# Patient Record
Sex: Female | Born: 1990 | Race: White | Hispanic: No | Marital: Single | Smoking: Former smoker
Health system: Southern US, Community
[De-identification: ages and names within clinical notes are randomized; demographics above are authoritative.]

## PROBLEM LIST (undated history)

## (undated) DIAGNOSIS — O24419 Gestational diabetes mellitus in pregnancy, unspecified control: Secondary | ICD-10-CM

## (undated) HISTORY — PX: NO PAST SURGERIES: SHX2092

---

## 2014-11-24 ENCOUNTER — Inpatient Hospital Stay: Payer: Self-pay | Admitting: Obstetrics and Gynecology

## 2014-11-24 LAB — CBC WITH DIFFERENTIAL/PLATELET
Basophil #: 0 10*3/uL (ref 0.0–0.1)
Basophil %: 0.1 %
Eosinophil #: 0.1 10*3/uL (ref 0.0–0.7)
Eosinophil %: 0.5 %
HCT: 42.2 % (ref 35.0–47.0)
HGB: 14.2 g/dL (ref 12.0–16.0)
LYMPHS PCT: 9.4 %
Lymphocyte #: 1 10*3/uL (ref 1.0–3.6)
MCH: 30.1 pg (ref 26.0–34.0)
MCHC: 33.7 g/dL (ref 32.0–36.0)
MCV: 89 fL (ref 80–100)
MONO ABS: 0.5 x10 3/mm (ref 0.2–0.9)
MONOS PCT: 5 %
Neutrophil #: 9.3 10*3/uL — ABNORMAL HIGH (ref 1.4–6.5)
Neutrophil %: 85 %
PLATELETS: 163 10*3/uL (ref 150–440)
RBC: 4.72 10*6/uL (ref 3.80–5.20)
RDW: 13.5 % (ref 11.5–14.5)
WBC: 10.9 10*3/uL (ref 3.6–11.0)

## 2014-11-24 LAB — PROTEIN / CREATININE RATIO, URINE
CREATININE, URINE: 99.2 mg/dL (ref 30.0–125.0)
PROTEIN/CREAT. RATIO: 141 mg/g{creat} (ref 0–200)
Protein, Random Urine: 14 mg/dL — ABNORMAL HIGH (ref 0–12)

## 2014-11-24 LAB — RAPID HIV SCREEN (HIV 1/2 AB+AG)

## 2014-11-24 LAB — GC/CHLAMYDIA PROBE AMP

## 2014-11-25 LAB — HEMOGLOBIN: HGB: 13.3 g/dL (ref 12.0–16.0)

## 2015-02-23 LAB — SURGICAL PATHOLOGY

## 2015-03-01 NOTE — Op Note (Signed)
PATIENT NAME:  Kristina Cox, Kristina Cox MR#:  [id number] DATE OF BIRTH:  December 08, 1990  DATE OF PROCEDURE:  11/24/2014  TIME: 2242.  PREOPERATIVE DIAGNOSES:  1.  Intrauterine pregnancy at term.  2.  Active labor.  3.  Group B streptococcus positive.  4.  Rh negative.   POSTOPERATIVE DIAGNOSES: 1.  Intrauterine pregnancy at term.  2.  Active labor.  3.  Group B streptococcus positive.  4.  Rh negative.  5.  Delivery of a viable female infant.   SURGEON: Cline Cools, MD  ESTIMATED BLOOD LOSS: 400 mL  ANESTHESIA: Epidural.  COMPLICATIONS: None.   FINDING: Viable female infant who was floppy on delivery, with apgars of 3, 4, and 7 at 1, 5, and 10 minutes respectively. Weight is not known at this time. Normal appearing large placental, no meconium.   INDICATION FOR PROCEDURE: Kristina Cox is a 24 year old gravida 1, para 0 who presented at 39-3/7 weeks estimated gestational age in active labor. She was found to be 4 cm, 100% effaced, and at -1 station on admission with a bulging bag. Her group B streptococcus ampicillin was started on admission, and she did receive adequate treatment during this labor course. She progressed nicely with artificial rupture of membranes for clear fluid and no other augmentation. She was comfortable with her epidural and became fully dilated at 2130, at which time her fetal heart rate tracing was a category 1 strip with a baseline rate of 130s with moderate variability, no accelerations, and no decelerations.   The patient pushed for a total of 55 minutes comfortably with her epidural. During the second stage she had several 4- to 5-minute episodes of fetal tachycardia into the 180s, with return to baseline and moderate variability throughout. Maternal temperature prior to delivery was 99.6. Just prior to delivery the baby did undergo a sustained tachycardic episode to the 190s  x 10 minutes. Moderate variability was maintained during this time; however, 3  minutes prior to delivery the fetal heart tones returned to the 90s and I was concerned that we might be picking up the maternal heart rate. However, maternal heart rate was palpated and it was noted that the baby was truly in the 90s to 115 range with moderate variability. At this time, consideration for operative vaginal delivery was made. However, maternal effort was used to expel the baby within the next minute. The fetal head was delivered followed by the left anterior shoulder using downward traction. No shoulder dystocia was noted, as the left shoulder easily slipped below the pelvic arch, but once the shoulder delivery the infant's body did not immediately follow.  The fetal body was delivered with effort with the anterior arm requiring extraction by antecubital flexion and sweeping the arm across the chest, followed by the posterior arm and the fetal body. There was no nuchal cord noted. The cord was quickly doubly clamped and cut and a 3-vessel cord was noted. The fetus was flaccid immediately upon delivery and was passed to awaiting nursery staff. A short period of drying and stimulation was undertaken on the maternal abdomen and then the baby was moved to the warmer. The placenta delivered spontaneously immediately following the baby and was intact.   A second-degree perineal laceration was repaired with 2-0 Vicryl and there were bilateral periurethral lacerations that were repaired with 3-0 Vicryl. Examination of the vagina and cervix revealed no further lacerations. The fundal tone was firm with bimanual massage. Postpartum Pitocin was hung and the placenta was sent  to pathology for evaluation after the neonatal resuscitation was required. he mom tolerated these procedures well and is stable in recovery. Of note, the fetal Apgars were 3, 4, and 7 at 1, 5, and 10 minutes, respectively. Fetal pH was 6.97. Fetal cord blood gas pH was 7.12, base excess was -22, with a decreased bicarbonate of 9.0. The  neonatal lactic acid off the arterial line was 13.2. The baby was given oxygen and at this time appears to be doing well, with good tone, good color, and good reflexes.   ____________________________ Cline CoolsBethany E. Ayushi Pla, MD beb:ST D: 11/25/2014 01:13:25 ET T: 11/25/2014 02:07:39 ET JOB#: [id number]  cc: Cline CoolsBethany E. Jabre Heo, MD, <Dictator> Cline CoolsBETHANY E Tevis Conger MD ELECTRONICALLY SIGNED 11/25/2014 18:04

## 2015-03-10 NOTE — H&P (Signed)
L&D Evaluation:  History:  HPI [age]yo G1P0 at 39+3wks presenting with contractions, good fetal movement and no vaginal bleeding. No LOF. Elevated BP on admission, but not through pregnancy. PIH labs drawn on admission were wnl. P/C ratio 141. No HA.  Preg c/b: Rh neg GBS pos- NKDA   Presents with contractions   Patient's Medical History No Chronic Illness   Patient's Surgical History none   Medications Pre Natal Vitamins   Allergies NKDA   Social History none   Family History Non-Contributory   ROS:  ROS All systems were reviewed.  HEENT, CNS, GI, GU, Respiratory, CV, Renal and Musculoskeletal systems were found to be normal.   Exam:  Vital Signs stable  BP >140/90   General no apparent distress   Mental Status clear   Chest clear   Heart normal sinus rhythm   Abdomen gravid, tender with contractions   Estimated Fetal Weight Average for gestational age   Fetal Position cephalic   Reflexes 1+   Clonus negative   Mebranes Intact, bulging bag   FHT Description Variable decelerations, on admission, no decels   Ucx regular   Impression:  Impression active labor   Plan:  Plan monitor BP, PIH panel, antibiotics for GBBS prophylaxis   Electronic Signatures: Cline CoolsBeasley, Renn Dirocco E (MD)  (Signed 25-Jan-16 18:08)  Authored: L&D Evaluation   Last Updated: 25-Jan-16 18:08 by Cline CoolsBeasley, Hermelinda Diegel E (MD)

## 2016-06-01 ENCOUNTER — Ambulatory Visit (INDEPENDENT_AMBULATORY_CARE_PROVIDER_SITE_OTHER): Payer: PRIVATE HEALTH INSURANCE | Admitting: Obstetrics and Gynecology

## 2016-06-01 ENCOUNTER — Encounter: Payer: Self-pay | Admitting: Obstetrics and Gynecology

## 2016-06-01 VITALS — BP 124/76 | HR 93 | Temp 98.4°F | Wt 123.6 lb

## 2016-06-01 DIAGNOSIS — Z3480 Encounter for supervision of other normal pregnancy, unspecified trimester: Secondary | ICD-10-CM | POA: Insufficient documentation

## 2016-06-01 DIAGNOSIS — Z331 Pregnant state, incidental: Secondary | ICD-10-CM | POA: Diagnosis not present

## 2016-06-01 DIAGNOSIS — Z3482 Encounter for supervision of other normal pregnancy, second trimester: Secondary | ICD-10-CM

## 2016-06-01 DIAGNOSIS — Z1389 Encounter for screening for other disorder: Secondary | ICD-10-CM | POA: Diagnosis not present

## 2016-06-01 DIAGNOSIS — Z3401 Encounter for supervision of normal first pregnancy, first trimester: Secondary | ICD-10-CM

## 2016-06-01 DIAGNOSIS — Z3492 Encounter for supervision of normal pregnancy, unspecified, second trimester: Secondary | ICD-10-CM | POA: Diagnosis not present

## 2016-06-01 LAB — POCT URINALYSIS DIPSTICK
Bilirubin, UA: NEGATIVE
Blood, UA: NEGATIVE
Glucose, UA: NEGATIVE
Ketones, UA: NEGATIVE
Leukocytes, UA: NEGATIVE
Nitrite, UA: NEGATIVE
Protein, UA: NEGATIVE
Spec Grav, UA: 1.015
Urobilinogen, UA: 0.2
pH, UA: 7

## 2016-06-01 MED ORDER — PROMETHAZINE HCL 25 MG PO TABS: 25.0000 mg | ORAL_TABLET | Freq: Four times a day (QID) | ORAL | 1 refills | Status: DC | PRN

## 2016-06-01 MED ORDER — BUTALBITAL-APAP-CAFFEINE 50-325-40 MG PO TABS: 1.0000 | ORAL_TABLET | Freq: Four times a day (QID) | ORAL | 0 refills | Status: DC | PRN

## 2016-06-01 NOTE — Patient Instructions (Addendum)
Second Trimester of Pregnancy The second trimester is from week 13 through week 28, months 4 through 6. The second trimester is often a time when you feel your best. Your body has also adjusted to being pregnant, and you begin to feel better physically. Usually, morning sickness has lessened or quit completely, you may have more energy, and you may have an increase in appetite. The second trimester is also a time when the fetus is growing rapidly. At the end of the sixth month, the fetus is about 9 inches long and weighs about 1 pounds. You will likely begin to feel the baby move (quickening) between 18 and 20 weeks of the pregnancy. BODY CHANGES Your body goes through many changes during pregnancy. The changes vary from woman to woman.   Your weight will continue to increase. You will notice your lower abdomen bulging out.  You may begin to get stretch marks on your hips, abdomen, and breasts.  You may develop headaches that can be relieved by medicines approved by your health care provider.  You may urinate more often because the fetus is pressing on your bladder.  You may develop or continue to have heartburn as a result of your pregnancy.  You may develop constipation because certain hormones are causing the muscles that push waste through your intestines to slow down.  You may develop hemorrhoids or swollen, bulging veins (varicose veins).  You may have back pain because of the weight gain and pregnancy hormones relaxing your joints between the bones in your pelvis and as a result of a shift in weight and the muscles that support your balance.  Your breasts will continue to grow and be tender.  Your gums may bleed and may be sensitive to brushing and flossing.  Dark spots or blotches (chloasma, mask of pregnancy) may develop on your face. This will likely fade after the baby is born.  A dark line from your belly button to the pubic area (linea nigra) may appear. This will likely  fade after the baby is born.  You may have changes in your hair. These can include thickening of your hair, rapid growth, and changes in texture. Some women also have hair loss during or after pregnancy, or hair that feels dry or thin. Your hair will most likely return to normal after your baby is born. WHAT TO EXPECT AT YOUR PRENATAL VISITS During a routine prenatal visit:  You will be weighed to make sure you and the fetus are growing normally.  Your blood pressure will be taken.  Your abdomen will be measured to track your baby's growth.  The fetal heartbeat will be listened to.  Any test results from the previous visit will be discussed. Your health care provider may ask you:  How you are feeling.  If you are feeling the baby move.  If you have had any abnormal symptoms, such as leaking fluid, bleeding, severe headaches, or abdominal cramping.  If you are using any tobacco products, including cigarettes, chewing tobacco, and electronic cigarettes.  If you have any questions. Other tests that may be performed during your second trimester include:  Blood tests that check for:  Low iron levels (anemia).  Gestational diabetes (between 24 and 28 weeks).  Rh antibodies.  Urine tests to check for infections, diabetes, or protein in the urine.  An ultrasound to confirm the proper growth and development of the baby.  An amniocentesis to check for possible genetic problems.  Fetal screens for spina bifida   and Down syndrome.  HIV (human immunodeficiency virus) testing. Routine prenatal testing includes screening for HIV, unless you choose not to have this test. HOME CARE INSTRUCTIONS   Avoid all smoking, herbs, alcohol, and unprescribed drugs. These chemicals affect the formation and growth of the baby.  Do not use any tobacco products, including cigarettes, chewing tobacco, and electronic cigarettes. If you need help quitting, ask your health care provider. You may receive  counseling support and other resources to help you quit.  Follow your health care provider's instructions regarding medicine use. There are medicines that are either safe or unsafe to take during pregnancy.  Exercise only as directed by your health care provider. Experiencing uterine cramps is a good sign to stop exercising.  Continue to eat regular, healthy meals.  Wear a good support bra for breast tenderness.  Do not use hot tubs, steam rooms, or saunas.  Wear your seat belt at all times when driving.  Avoid raw meat, uncooked cheese, cat litter boxes, and soil used by cats. These carry germs that can cause birth defects in the baby.  Take your prenatal vitamins.  Take 1500-2000 mg of calcium daily starting at the 20th week of pregnancy until you deliver your baby.  Try taking a stool softener (if your health care provider approves) if you develop constipation. Eat more high-fiber foods, such as fresh vegetables or fruit and whole grains. Drink plenty of fluids to keep your urine clear or pale yellow.  Take warm sitz baths to soothe any pain or discomfort caused by hemorrhoids. Use hemorrhoid cream if your health care provider approves.  If you develop varicose veins, wear support hose. Elevate your feet for 15 minutes, 3-4 times a day. Limit salt in your diet.  Avoid heavy lifting, wear low heel shoes, and practice good posture.  Rest with your legs elevated if you have leg cramps or low back pain.  Visit your dentist if you have not gone yet during your pregnancy. Use a soft toothbrush to brush your teeth and be gentle when you floss.  A sexual relationship may be continued unless your health care provider directs you otherwise.  Continue to go to all your prenatal visits as directed by your health care provider. SEEK MEDICAL CARE IF:   You have dizziness.  You have mild pelvic cramps, pelvic pressure, or nagging pain in the abdominal area.  You have persistent nausea,  vomiting, or diarrhea.  You have a bad smelling vaginal discharge.  You have pain with urination. SEEK IMMEDIATE MEDICAL CARE IF:   You have a fever.  You are leaking fluid from your vagina.  You have spotting or bleeding from your vagina.  You have severe abdominal cramping or pain.  You have rapid weight gain or loss.  You have shortness of breath with chest pain.  You notice sudden or extreme swelling of your face, hands, ankles, feet, or legs.  You have not felt your baby move in over an hour.  You have severe headaches that do not go away with medicine.  You have vision changes.   This information is not intended to replace advice given to you by your health care provider. Make sure you discuss any questions you have with your health care provider.   Document Released: 10/11/2001 Document Revised: 11/07/2014 Document Reviewed: 12/18/2012 Elsevier Interactive Patient Education 2016 [employer]  Contraception Choices Contraception (birth control) is the use of any methods or devices to prevent pregnancy. Below are some methods to   help avoid pregnancy. HORMONAL METHODS   Contraceptive implant. This is a thin, plastic tube containing progesterone hormone. It does not contain estrogen hormone. Your health care provider inserts the tube in the inner part of the upper arm. The tube can remain in place for up to 3 years. After 3 years, the implant must be removed. The implant prevents the ovaries from releasing an egg (ovulation), thickens the cervical mucus to prevent sperm from entering the uterus, and thins the lining of the inside of the uterus.  Progesterone-only injections. These injections are given every 3 months by your health care provider to prevent pregnancy. This synthetic progesterone hormone stops the ovaries from releasing eggs. It also thickens cervical mucus and changes the uterine lining. This makes it harder for sperm to survive in the uterus.  Birth  control pills. These pills contain estrogen and progesterone hormone. They work by preventing the ovaries from releasing eggs (ovulation). They also cause the cervical mucus to thicken, preventing the sperm from entering the uterus. Birth control pills are prescribed by a health care provider.Birth control pills can also be used to treat heavy periods.  Minipill. This type of birth control pill contains only the progesterone hormone. They are taken every day of each month and must be prescribed by your health care provider.  Birth control patch. The patch contains hormones similar to those in birth control pills. It must be changed once a week and is prescribed by a health care provider.  Vaginal ring. The ring contains hormones similar to those in birth control pills. It is left in the vagina for 3 weeks, removed for 1 week, and then a new one is put back in place. The patient must be comfortable inserting and removing the ring from the vagina.A health care provider's prescription is necessary.  Emergency contraception. Emergency contraceptives prevent pregnancy after unprotected sexual intercourse. This pill can be taken right after sex or up to 5 days after unprotected sex. It is most effective the sooner you take the pills after having sexual intercourse. Most emergency contraceptive pills are available without a prescription. Check with your pharmacist. Do not use emergency contraception as your only form of birth control. BARRIER METHODS   Female condom. This is a thin sheath (latex or rubber) that is worn over the penis during sexual intercourse. It can be used with spermicide to increase effectiveness.  Female condom. This is a soft, loose-fitting sheath that is put into the vagina before sexual intercourse.  Diaphragm. This is a soft, latex, dome-shaped barrier that must be fitted by a health care provider. It is inserted into the vagina, along with a spermicidal jelly. It is inserted before  intercourse. The diaphragm should be left in the vagina for 6 to 8 hours after intercourse.  Cervical cap. This is a round, soft, latex or plastic cup that fits over the cervix and must be fitted by a health care provider. The cap can be left in place for up to 48 hours after intercourse.  Sponge. This is a soft, circular piece of polyurethane foam. The sponge has spermicide in it. It is inserted into the vagina after wetting it and before sexual intercourse.  Spermicides. These are chemicals that kill or block sperm from entering the cervix and uterus. They come in the form of creams, jellies, suppositories, foam, or tablets. They do not require a prescription. They are inserted into the vagina with an applicator before having sexual intercourse. The process must be repeated every   time you have sexual intercourse. INTRAUTERINE CONTRACEPTION  Intrauterine device (IUD). This is a T-shaped device that is put in a woman's uterus during a menstrual period to prevent pregnancy. There are 2 types:  Copper IUD. This type of IUD is wrapped in copper wire and is placed inside the uterus. Copper makes the uterus and fallopian tubes produce a fluid that kills sperm. It can stay in place for 10 years.  Hormone IUD. This type of IUD contains the hormone progestin (synthetic progesterone). The hormone thickens the cervical mucus and prevents sperm from entering the uterus, and it also thins the uterine lining to prevent implantation of a fertilized egg. The hormone can weaken or kill the sperm that get into the uterus. It can stay in place for 3-5 years, depending on which type of IUD is used. PERMANENT METHODS OF CONTRACEPTION  Female tubal ligation. This is when the woman's fallopian tubes are surgically sealed, tied, or blocked to prevent the egg from traveling to the uterus.  Hysteroscopic sterilization. This involves placing a small coil or insert into each fallopian tube. Your doctor uses a technique  called hysteroscopy to do the procedure. The device causes scar tissue to form. This results in permanent blockage of the fallopian tubes, so the sperm cannot fertilize the egg. It takes about 3 months after the procedure for the tubes to become blocked. You must use another form of birth control for these 3 months.  Female sterilization. This is when the female has the tubes that carry sperm tied off (vasectomy).This blocks sperm from entering the vagina during sexual intercourse. After the procedure, the man can still ejaculate fluid (semen). NATURAL PLANNING METHODS  Natural family planning. This is not having sexual intercourse or using a barrier method (condom, diaphragm, cervical cap) on days the woman could become pregnant.  Calendar method. This is keeping track of the length of each menstrual cycle and identifying when you are fertile.  Ovulation method. This is avoiding sexual intercourse during ovulation.  Symptothermal method. This is avoiding sexual intercourse during ovulation, using a thermometer and ovulation symptoms.  Post-ovulation method. This is timing sexual intercourse after you have ovulated. Regardless of which type or method of contraception you choose, it is important that you use condoms to protect against the transmission of sexually transmitted infections (STIs). Talk with your health care provider about which form of contraception is most appropriate for you.   This information is not intended to replace advice given to you by your health care provider. Make sure you discuss any questions you have with your health care provider.   Document Released: 10/17/2005 Document Revised: 10/22/2013 Document Reviewed: 04/11/2013 Elsevier Interactive Patient Education 2016 [employer]  Breastfeeding Deciding to breastfeed is one of the best choices you can make for you and your baby. A change in hormones during pregnancy causes your breast tissue to grow and increases the  number and size of your milk ducts. These hormones also allow proteins, sugars, and fats from your blood supply to make breast milk in your milk-producing glands. Hormones prevent breast milk from being released before your baby is born as well as prompt milk flow after birth. Once breastfeeding has begun, thoughts of your baby, as well as his or her sucking or crying, can stimulate the release of milk from your milk-producing glands.  BENEFITS OF BREASTFEEDING For Your Baby  Your first milk (colostrum) helps your baby's digestive system function better.  There are antibodies in your milk that help   your baby fight off infections.  Your baby has a lower incidence of asthma, allergies, and sudden infant death syndrome.  The nutrients in breast milk are better for your baby than infant formulas and are designed uniquely for your baby's needs.  Breast milk improves your baby's brain development.  Your baby is less likely to develop other conditions, such as childhood obesity, asthma, or type 2 diabetes mellitus. For You  Breastfeeding helps to create a very special bond between you and your baby.  Breastfeeding is convenient. Breast milk is always available at the correct temperature and costs nothing.  Breastfeeding helps to burn calories and helps you lose the weight gained during pregnancy.  Breastfeeding makes your uterus contract to its prepregnancy size faster and slows bleeding (lochia) after you give birth.   Breastfeeding helps to lower your risk of developing type 2 diabetes mellitus, osteoporosis, and breast or ovarian cancer later in life. SIGNS THAT YOUR BABY IS HUNGRY Early Signs of Hunger  Increased alertness or activity.  Stretching.  Movement of the head from side to side.  Movement of the head and opening of the mouth when the corner of the mouth or cheek is stroked (rooting).  Increased sucking sounds, smacking lips, cooing, sighing, or squeaking.  Hand-to-mouth  movements.  Increased sucking of fingers or hands. Late Signs of Hunger  Fussing.  Intermittent crying. Extreme Signs of Hunger Signs of extreme hunger will require calming and consoling before your baby will be able to breastfeed successfully. Do not wait for the following signs of extreme hunger to occur before you initiate breastfeeding:  Restlessness.  A loud, strong cry.  Screaming. BREASTFEEDING BASICS Breastfeeding Initiation  Find a comfortable place to sit or lie down, with your neck and back well supported.  Place a pillow or rolled up blanket under your baby to bring him or her to the level of your breast (if you are seated). Nursing pillows are specially designed to help support your arms and your baby while you breastfeed.  Make sure that your baby's abdomen is facing your abdomen.  Gently massage your breast. With your fingertips, massage from your chest wall toward your nipple in a circular motion. This encourages milk flow. You may need to continue this action during the feeding if your milk flows slowly.  Support your breast with 4 fingers underneath and your thumb above your nipple. Make sure your fingers are well away from your nipple and your baby's mouth.  Stroke your baby's lips gently with your finger or nipple.  When your baby's mouth is open wide enough, quickly bring your baby to your breast, placing your entire nipple and as much of the colored area around your nipple (areola) as possible into your baby's mouth.  More areola should be visible above your baby's upper lip than below the lower lip.  Your baby's tongue should be between his or her lower gum and your breast.  Ensure that your baby's mouth is correctly positioned around your nipple (latched). Your baby's lips should create a seal on your breast and be turned out (everted).  It is common for your baby to suck about 2-3 minutes in order to start the flow of breast milk. Latching Teaching  your baby how to latch on to your breast properly is very important. An improper latch can cause nipple pain and decreased milk supply for you and poor weight gain in your baby. Also, if your baby is not latched onto your nipple properly, he   or she may swallow some air during feeding. This can make your baby fussy. Burping your baby when you switch breasts during the feeding can help to get rid of the air. However, teaching your baby to latch on properly is still the best way to prevent fussiness from swallowing air while breastfeeding. Signs that your baby has successfully latched on to your nipple:  Silent tugging or silent sucking, without causing you pain.  Swallowing heard between every 3-4 sucks.  Muscle movement above and in front of his or her ears while sucking. Signs that your baby has not successfully latched on to nipple:  Sucking sounds or smacking sounds from your baby while breastfeeding.  Nipple pain. If you think your baby has not latched on correctly, slip your finger into the corner of your baby's mouth to break the suction and place it between your baby's gums. Attempt breastfeeding initiation again. Signs of Successful Breastfeeding Signs from your baby:  A gradual decrease in the number of sucks or complete cessation of sucking.  Falling asleep.  Relaxation of his or her body.  Retention of a small amount of milk in his or her mouth.  Letting go of your breast by himself or herself. Signs from you:  Breasts that have increased in firmness, weight, and size 1-3 hours after feeding.  Breasts that are softer immediately after breastfeeding.  Increased milk volume, as well as a change in milk consistency and color by the fifth day of breastfeeding.  Nipples that are not sore, cracked, or bleeding. Signs That Your Baby is Getting Enough Milk  Wetting at least 3 diapers in a 24-hour period. The urine should be clear and pale yellow by age 5 days.  At least 3  stools in a 24-hour period by age 5 days. The stool should be soft and yellow.  At least 3 stools in a 24-hour period by age 7 days. The stool should be seedy and yellow.  No loss of weight greater than 10% of birth weight during the first 3 days of age.  Average weight gain of 4-7 ounces (113-198 g) per week after age 4 days.  Consistent daily weight gain by age 5 days, without weight loss after the age of 2 weeks. After a feeding, your baby may spit up a small amount. This is common. BREASTFEEDING FREQUENCY AND DURATION Frequent feeding will help you make more milk and can prevent sore nipples and breast engorgement. Breastfeed when you feel the need to reduce the fullness of your breasts or when your baby shows signs of hunger. This is called "breastfeeding on demand." Avoid introducing a pacifier to your baby while you are working to establish breastfeeding (the first 4-6 weeks after your baby is born). After this time you may choose to use a pacifier. Research has shown that pacifier use during the first year of a baby's life decreases the risk of sudden infant death syndrome (SIDS). Allow your baby to feed on each breast as long as he or she wants. Breastfeed until your baby is finished feeding. When your baby unlatches or falls asleep while feeding from the first breast, offer the second breast. Because newborns are often sleepy in the first few weeks of life, you may need to awaken your baby to get him or her to feed. Breastfeeding times will vary from baby to baby. However, the following rules can serve as a guide to help you ensure that your baby is properly fed:  Newborns (babies 4 weeks   of age or younger) may breastfeed every 1-3 hours.  Newborns should not go longer than 3 hours during the day or 5 hours during the night without breastfeeding.  You should breastfeed your baby a minimum of 8 times in a 24-hour period until you begin to introduce solid foods to your baby at around 6  months of age. BREAST MILK PUMPING Pumping and storing breast milk allows you to ensure that your baby is exclusively fed your breast milk, even at times when you are unable to breastfeed. This is especially important if you are going back to work while you are still breastfeeding or when you are not able to be present during feedings. Your lactation consultant can give you guidelines on how long it is safe to store breast milk. A breast pump is a machine that allows you to pump milk from your breast into a sterile bottle. The pumped breast milk can then be stored in a refrigerator or freezer. Some breast pumps are operated by hand, while others use electricity. Ask your lactation consultant which type will work best for you. Breast pumps can be purchased, but some hospitals and breastfeeding support groups lease breast pumps on a monthly basis. A lactation consultant can teach you how to hand express breast milk, if you prefer not to use a pump. CARING FOR YOUR BREASTS WHILE YOU BREASTFEED Nipples can become dry, cracked, and sore while breastfeeding. The following recommendations can help keep your breasts moisturized and healthy:  Avoid using soap on your nipples.  Wear a supportive bra. Although not required, special nursing bras and tank tops are designed to allow access to your breasts for breastfeeding without taking off your entire bra or top. Avoid wearing underwire-style bras or extremely tight bras.  Air dry your nipples for 3-4minutes after each feeding.  Use only cotton bra pads to absorb leaked breast milk. Leaking of breast milk between feedings is normal.  Use lanolin on your nipples after breastfeeding. Lanolin helps to maintain your skin's normal moisture barrier. If you use pure lanolin, you do not need to wash it off before feeding your baby again. Pure lanolin is not toxic to your baby. You may also hand express a few drops of breast milk and gently massage that milk into your  nipples and allow the milk to air dry. In the first few weeks after giving birth, some women experience extremely full breasts (engorgement). Engorgement can make your breasts feel heavy, warm, and tender to the touch. Engorgement peaks within 3-5 days after you give birth. The following recommendations can help ease engorgement:  Completely empty your breasts while breastfeeding or pumping. You may want to start by applying warm, moist heat (in the shower or with warm water-soaked hand towels) just before feeding or pumping. This increases circulation and helps the milk flow. If your baby does not completely empty your breasts while breastfeeding, pump any extra milk after he or she is finished.  Wear a snug bra (nursing or regular) or tank top for 1-2 days to signal your body to slightly decrease milk production.  Apply ice packs to your breasts, unless this is too uncomfortable for you.  Make sure that your baby is latched on and positioned properly while breastfeeding. If engorgement persists after 48 hours of following these recommendations, contact your health care provider or a lactation consultant. OVERALL HEALTH CARE RECOMMENDATIONS WHILE BREASTFEEDING  Eat healthy foods. Alternate between meals and snacks, eating 3 of each per day. Because what   you eat affects your breast milk, some of the foods may make your baby more irritable than usual. Avoid eating these foods if you are sure that they are negatively affecting your baby.  Drink milk, fruit juice, and water to satisfy your thirst (about 10 glasses a day).  Rest often, relax, and continue to take your prenatal vitamins to prevent fatigue, stress, and anemia.  Continue breast self-awareness checks.  Avoid chewing and smoking tobacco. Chemicals from cigarettes that pass into breast milk and exposure to secondhand smoke may harm your baby.  Avoid alcohol and drug use, including marijuana. Some medicines that may be harmful to your  baby can pass through breast milk. It is important to ask your health care provider before taking any medicine, including all over-the-counter and prescription medicine as well as vitamin and herbal supplements. It is possible to become pregnant while breastfeeding. If birth control is desired, ask your health care provider about options that will be safe for your baby. SEEK MEDICAL CARE IF:  You feel like you want to stop breastfeeding or have become frustrated with breastfeeding.  You have painful breasts or nipples.  Your nipples are cracked or bleeding.  Your breasts are red, tender, or warm.  You have a swollen area on either breast.  You have a fever or chills.  You have nausea or vomiting.  You have drainage other than breast milk from your nipples.  Your breasts do not become full before feedings by the fifth day after you give birth.  You feel sad and depressed.  Your baby is too sleepy to eat well.  Your baby is having trouble sleeping.   Your baby is wetting less than 3 diapers in a 24-hour period.  Your baby has less than 3 stools in a 24-hour period.  Your baby's skin or the white part of his or her eyes becomes yellow.   Your baby is not gaining weight by 5 days of age. SEEK IMMEDIATE MEDICAL CARE IF:  Your baby is overly tired (lethargic) and does not want to wake up and feed.  Your baby develops an unexplained fever.   This information is not intended to replace advice given to you by your health care provider. Make sure you discuss any questions you have with your health care provider.   Document Released: 10/17/2005 Document Revised: 07/08/2015 Document Reviewed: 04/10/2013 Elsevier Interactive Patient Education 2016 Elsevier Inc.  

## 2016-06-01 NOTE — Progress Notes (Signed)
  Subjective:    Kristina Cox is a [id number] [redacted]w[redacted]d being seen today for her first obstetrical visit.  Her obstetrical history is significant for normal uncomplicated first pregnancy. Patient does intend to breast feed. Pregnancy history fully reviewed.  Patient reports headache and nausea.  Vitals:   06/01/16 1012  BP: 124/76  Pulse: 93  Temp: 98.4 F (36.9 C)  Weight: 123 lb 9.6 oz (56.1 kg)    HISTORY: OB History  Gravida Para Term Preterm AB Living  3 1 1   1 1   SAB TAB Ectopic Multiple Live Births    1     1    # Outcome Date GA Lbr Len/2nd Weight Sex Delivery Anes PTL Lv  3 Current           2 TAB 10/2015          1 Term 11/24/14 [redacted]w[redacted]d  8 lb 11 oz (3.941 kg) F Vag-Spont EPI N LIV     Complications: Shoulder Dystocia     Past Medical History:  Diagnosis Date  . Medical history non-contributory    History reviewed. No pertinent surgical history. Family History  Problem Relation Age of Onset  . Cancer Maternal Grandmother      Exam    Uterus:     Pelvic Exam:    Perineum: No Hemorrhoids, Normal Perineum   Vulva: normal   Vagina:  normal mucosa, normal discharge   pH:    Cervix: multiparous appearance and cervix closed and long   Adnexa: normal adnexa and no mass, fullness, tenderness   Bony Pelvis: gynecoid  System: Breast:  normal appearance, no masses or tenderness   Skin: normal coloration and turgor, no rashes    Neurologic: oriented, no focal deficits   Extremities: normal strength, tone, and muscle mass   HEENT extra ocular movement intact   Mouth/Teeth mucous membranes moist, pharynx normal without lesions and dental hygiene good   Neck supple and no masses   Cardiovascular: regular rate and rhythm   Respiratory:  chest clear, no wheezing, crepitations, rhonchi, normal symmetric air entry   Abdomen: soft, non-tender; bowel sounds normal; no masses,  no organomegaly   Urinary:       Assessment:    Pregnancy: G3P1011 Patient Active  Problem List   Diagnosis Date Noted  . Supervision of other normal pregnancy, antepartum 06/01/2016        Plan:     Initial labs drawn. Prenatal vitamins. Problem list reviewed and updated. Genetic Screening discussed Quad Screen: requested.Will be done at next visit  Ultrasound discussed; fetal survey: requested. Rx phenergan provided. Patient with h/o migraine headaches which seems to be getting worst. She has not taken tylenol to treat them but has used a friend's Fioricet with good results. Advised to try tylenol and good hydration. Rx Fioricet made available to the patient as well  Follow up in 4 weeks. 50% of 30 min visit spent on counseling and coordination of care.     Kapri Nero 06/01/2016

## 2016-06-03 LAB — CULTURE, OB URINE

## 2016-06-03 LAB — GC/CHLAMYDIA PROBE AMP
CHLAMYDIA, DNA PROBE: NEGATIVE
Neisseria gonorrhoeae by PCR: NEGATIVE

## 2016-06-03 LAB — URINE CULTURE, OB REFLEX: ORGANISM ID, BACTERIA: NO GROWTH

## 2016-06-07 LAB — PAP IG W/ RFLX HPV ASCU: PAP SMEAR COMMENT: 0

## 2016-06-07 LAB — HPV DNA PROBE HIGH RISK, AMPLIFIED: HPV, HIGH-RISK: POSITIVE — AB

## 2016-06-09 ENCOUNTER — Encounter: Payer: Self-pay | Admitting: Obstetrics and Gynecology

## 2016-06-09 DIAGNOSIS — O26899 Other specified pregnancy related conditions, unspecified trimester: Secondary | ICD-10-CM

## 2016-06-09 DIAGNOSIS — Z6791 Unspecified blood type, Rh negative: Secondary | ICD-10-CM | POA: Insufficient documentation

## 2016-06-09 LAB — PRENATAL PROFILE I(LABCORP)
Antibody Screen: NEGATIVE
BASOS ABS: 0 10*3/uL (ref 0.0–0.2)
Basos: 0 %
EOS (ABSOLUTE): 0.1 10*3/uL (ref 0.0–0.4)
Eos: 2 %
Hematocrit: 40.9 % (ref 34.0–46.6)
Hemoglobin: 13.5 g/dL (ref 11.1–15.9)
Hepatitis B Surface Ag: NEGATIVE
Immature Grans (Abs): 0 10*3/uL (ref 0.0–0.1)
Immature Granulocytes: 1 %
LYMPHS ABS: 1.3 10*3/uL (ref 0.7–3.1)
Lymphs: 21 %
MCH: 28.8 pg (ref 26.6–33.0)
MCHC: 33 g/dL (ref 31.5–35.7)
MCV: 87 fL (ref 79–97)
Monocytes Absolute: 0.3 10*3/uL (ref 0.1–0.9)
Monocytes: 6 %
NEUTROS PCT: 70 %
Neutrophils Absolute: 4.3 10*3/uL (ref 1.4–7.0)
PLATELETS: 223 10*3/uL (ref 150–379)
RBC: 4.68 x10E6/uL (ref 3.77–5.28)
RDW: 15.9 % — ABNORMAL HIGH (ref 12.3–15.4)
RPR Ser Ql: NONREACTIVE
Rh Factor: NEGATIVE
Rubella Antibodies, IGG: 2.19 index (ref >0.99)
WBC: 6.1 10*3/uL (ref 3.4–10.8)

## 2016-06-09 LAB — VARICELLA ZOSTER ANTIBODY, IGG: Varicella zoster IgG: 1111 index (ref >165)

## 2016-06-09 LAB — TOXASSURE SELECT 13 (MW), URINE: PDF: 0

## 2016-06-09 LAB — HIV ANTIBODY (ROUTINE TESTING W REFLEX): HIV SCREEN 4TH GENERATION: NONREACTIVE

## 2016-06-29 ENCOUNTER — Ambulatory Visit (INDEPENDENT_AMBULATORY_CARE_PROVIDER_SITE_OTHER): Payer: Medicaid Other | Admitting: Obstetrics and Gynecology

## 2016-06-29 VITALS — BP 105/67 | HR 76 | Temp 98.5°F | Wt 129.0 lb

## 2016-06-29 DIAGNOSIS — Z331 Pregnant state, incidental: Secondary | ICD-10-CM

## 2016-06-29 DIAGNOSIS — O360120 Maternal care for anti-D [Rh] antibodies, second trimester, not applicable or unspecified: Secondary | ICD-10-CM | POA: Diagnosis not present

## 2016-06-29 DIAGNOSIS — Z3482 Encounter for supervision of other normal pregnancy, second trimester: Secondary | ICD-10-CM | POA: Diagnosis not present

## 2016-06-29 DIAGNOSIS — Z1389 Encounter for screening for other disorder: Secondary | ICD-10-CM

## 2016-06-29 LAB — POCT URINALYSIS DIPSTICK
BILIRUBIN UA: NEGATIVE
Blood, UA: NEGATIVE
Glucose, UA: NEGATIVE
KETONES UA: NEGATIVE
Leukocytes, UA: NEGATIVE
Nitrite, UA: NEGATIVE
PH UA: 7
PROTEIN UA: NEGATIVE
SPEC GRAV UA: 1.01
Urobilinogen, UA: 0.2

## 2016-06-29 NOTE — Addendum Note (Signed)
Addended by: Francene FindersJAMES, QUINETTA C on: 06/29/2016 10:09 AM   Modules accepted: Orders

## 2016-06-29 NOTE — Progress Notes (Signed)
Pt. Has a question about getting a genetics test done.

## 2016-06-29 NOTE — Addendum Note (Signed)
Addended by: Catalina AntiguaONSTANT, Quentyn Kolbeck on: 06/29/2016 09:42 AM   Modules accepted: Orders

## 2016-06-29 NOTE — Progress Notes (Signed)
   PRENATAL VISIT NOTE  Subjective:  Kristina Cox is a 25 y.o. G3P1011 at 7497w5d being seen today for ongoing prenatal care.  She is currently monitored for the following issues for this low-risk pregnancy and has Supervision of other normal pregnancy, antepartum and Rh negative, antepartum on her problem list.  Patient reports no complaints.  Contractions: Not present. Vag. Bleeding: None.  Movement: Present. Denies leaking of fluid.   The following portions of the patient's history were reviewed and updated as appropriate: allergies, current medications, past family history, past medical history, past social history, past surgical history and problem list. Problem list updated.  Objective:   Vitals:   06/29/16 0908  BP: 105/67  Pulse: 76  Temp: 98.5 F (36.9 C)  Weight: 129 lb (58.5 kg)    Fetal Status: Fetal Heart Rate (bpm): 145   Movement: Present     General:  Alert, oriented and cooperative. Patient is in no acute distress.  Skin: Skin is warm and dry. No rash noted.   Cardiovascular: Normal heart rate noted  Respiratory: Normal respiratory effort, no problems with respiration noted  Abdomen: Soft, gravid, appropriate for gestational age. Pain/Pressure: Absent     Pelvic:  Cervical exam deferred        Extremities: Normal range of motion.  Edema: None  Mental Status: Normal mood and affect. Normal behavior. Normal judgment and thought content.   Urinalysis:      Assessment and Plan:  Pregnancy: G3P1011 at 4597w5d  1. Supervision of other normal pregnancy, antepartum, second trimester Patient is doing well without complaints Quad screen today Anatomy ultrasound ordered - US OB Comp + 14 Wk; Future - AFP, Quad Screen  2. Rh negative, antepartum, second trimester, not applicable or unspecified fetus Will receive rhogam at 28 weeks  General obstetric precautions including but not limited to vaginal bleeding, contractions, leaking of fluid and fetal movement  were reviewed in detail with the patient. Please refer to After Visit Summary for other counseling recommendations.  Return in about 4 weeks (around 07/27/2016).  Catalina AntiguaPeggy Graciella Arment, MD

## 2016-07-06 ENCOUNTER — Telehealth: Payer: Self-pay | Admitting: *Deleted

## 2016-07-06 LAB — AFP, QUAD SCREEN
DIA Mom Value: 0.81
DIA Value (EIA): 156.41 pg/mL
DSR (BY AGE) 1 IN: 1084
DSR (Second Trimester) 1 IN: 10000
Gestational Age: 16.7 WEEKS
MATERNAL AGE AT EDD: 23.6 a
MSAFP MOM: 0.71
MSAFP: 28.5 ng/mL
MSHCG Mom: 0.49
MSHCG: 18930 m[IU]/mL
OSB RISK: 10000
PDF: 0
T18 (By Age): 1:4225 {titer}
Test Results:: NEGATIVE
UE3 VALUE: 1.44 ng/mL
Weight: 129 [lb_av]
uE3 Mom: 1.4

## 2016-07-06 NOTE — Telephone Encounter (Signed)
Missing Quad Screen information called to [facility].

## 2016-07-13 ENCOUNTER — Encounter (HOSPITAL_COMMUNITY): Payer: Self-pay | Admitting: Obstetrics and Gynecology

## 2016-07-20 ENCOUNTER — Other Ambulatory Visit: Payer: Self-pay | Admitting: Obstetrics and Gynecology

## 2016-07-20 ENCOUNTER — Ambulatory Visit (HOSPITAL_COMMUNITY)
Admission: RE | Admit: 2016-07-20 | Discharge: 2016-07-20 | Disposition: A | Discharge status: Home / Self Care | Payer: Medicaid Other | Source: Ambulatory Visit | Attending: Obstetrics and Gynecology | Admitting: Obstetrics and Gynecology

## 2016-07-20 DIAGNOSIS — Z3A19 19 weeks gestation of pregnancy: Secondary | ICD-10-CM | POA: Diagnosis not present

## 2016-07-20 DIAGNOSIS — Z36 Encounter for antenatal screening of mother: Secondary | ICD-10-CM | POA: Diagnosis not present

## 2016-07-20 DIAGNOSIS — Z1389 Encounter for screening for other disorder: Secondary | ICD-10-CM

## 2016-07-20 DIAGNOSIS — Z3482 Encounter for supervision of other normal pregnancy, second trimester: Secondary | ICD-10-CM

## 2016-07-27 ENCOUNTER — Ambulatory Visit (INDEPENDENT_AMBULATORY_CARE_PROVIDER_SITE_OTHER): Payer: Medicaid Other | Admitting: Obstetrics and Gynecology

## 2016-07-27 VITALS — BP 112/70 | HR 94 | Temp 99.7°F | Wt 131.8 lb

## 2016-07-27 DIAGNOSIS — Z3482 Encounter for supervision of other normal pregnancy, second trimester: Secondary | ICD-10-CM

## 2016-07-27 DIAGNOSIS — O360120 Maternal care for anti-D [Rh] antibodies, second trimester, not applicable or unspecified: Secondary | ICD-10-CM | POA: Diagnosis not present

## 2016-07-27 NOTE — Progress Notes (Signed)
Patient states that she is feeling well. 

## 2016-07-27 NOTE — Progress Notes (Signed)
   PRENATAL VISIT NOTE  Subjective:  Kristina Cox is a 25 y.o. G3P1011 at 6629w5d being seen today for ongoing prenatal care.  She is currently monitored for the following issues for this low-risk pregnancy and has Supervision of other normal pregnancy, antepartum and Rh negative, antepartum on her problem list.  Patient reports no complaints.  Contractions: Not present. Vag. Bleeding: None.  Movement: Present. Denies leaking of fluid.   The following portions of the patient's history were reviewed and updated as appropriate: allergies, current medications, past family history, past medical history, past social history, past surgical history and problem list. Problem list updated.  Objective:   Vitals:   07/27/16 0900  BP: 112/70  Pulse: 94  Temp: 99.7 F (37.6 C)  Weight: 131 lb 12.8 oz (59.8 kg)    Fetal Status: Fetal Heart Rate (bpm): 145 Fundal Height: 20 cm Movement: Present     General:  Alert, oriented and cooperative. Patient is in no acute distress.  Skin: Skin is warm and dry. No rash noted.   Cardiovascular: Normal heart rate noted  Respiratory: Normal respiratory effort, no problems with respiration noted  Abdomen: Soft, gravid, appropriate for gestational age. Pain/Pressure: Absent     Pelvic:  Cervical exam deferred        Extremities: Normal range of motion.  Edema: None  Mental Status: Normal mood and affect. Normal behavior. Normal judgment and thought content.   Urinalysis:      Assessment and Plan:  Pregnancy: G3P1011 at 5029w5d  1. Supervision of other normal pregnancy, antepartum, second trimester Patient is doing well without complaints Anatomy ultrasound reviewed. Follow up anatomy ordered Patient declined flu vaccine - US MFM OB FOLLOW UP; Future  2. Rh negative, antepartum, second trimester, not applicable or unspecified fetus Will receive rhogam at 28 weeks and evaluated during pp period  General obstetric precautions including but not  limited to vaginal bleeding, contractions, leaking of fluid and fetal movement were reviewed in detail with the patient. Please refer to After Visit Summary for other counseling recommendations.  Return in about 4 weeks (around 08/24/2016).  Catalina AntiguaPeggy Briselda Naval, MD

## 2016-08-19 ENCOUNTER — Ambulatory Visit (HOSPITAL_COMMUNITY)
Admission: RE | Admit: 2016-08-19 | Discharge: 2016-08-19 | Disposition: A | Discharge status: Home / Self Care | Payer: BLUE CROSS/BLUE SHIELD | Source: Ambulatory Visit | Attending: Obstetrics and Gynecology | Admitting: Obstetrics and Gynecology

## 2016-08-19 DIAGNOSIS — Z3482 Encounter for supervision of other normal pregnancy, second trimester: Secondary | ICD-10-CM

## 2016-08-19 DIAGNOSIS — Z3A24 24 weeks gestation of pregnancy: Secondary | ICD-10-CM | POA: Diagnosis not present

## 2016-08-19 DIAGNOSIS — Z362 Encounter for other antenatal screening follow-up: Secondary | ICD-10-CM | POA: Diagnosis not present

## 2016-08-20 ENCOUNTER — Encounter: Payer: Self-pay | Admitting: Obstetrics and Gynecology

## 2016-08-20 DIAGNOSIS — O358XX0 Maternal care for other (suspected) fetal abnormality and damage, not applicable or unspecified: Secondary | ICD-10-CM | POA: Insufficient documentation

## 2016-08-20 DIAGNOSIS — O35EXX0 Maternal care for other (suspected) fetal abnormality and damage, fetal genitourinary anomalies, not applicable or unspecified: Secondary | ICD-10-CM | POA: Insufficient documentation

## 2016-08-24 ENCOUNTER — Ambulatory Visit (INDEPENDENT_AMBULATORY_CARE_PROVIDER_SITE_OTHER): Payer: Medicaid Other | Admitting: Obstetrics and Gynecology

## 2016-08-24 VITALS — BP 106/66 | HR 97 | Temp 97.5°F | Wt 142.4 lb

## 2016-08-24 DIAGNOSIS — O26899 Other specified pregnancy related conditions, unspecified trimester: Secondary | ICD-10-CM

## 2016-08-24 DIAGNOSIS — O358XX0 Maternal care for other (suspected) fetal abnormality and damage, not applicable or unspecified: Secondary | ICD-10-CM

## 2016-08-24 DIAGNOSIS — O35EXX0 Maternal care for other (suspected) fetal abnormality and damage, fetal genitourinary anomalies, not applicable or unspecified: Secondary | ICD-10-CM

## 2016-08-24 DIAGNOSIS — O283 Abnormal ultrasonic finding on antenatal screening of mother: Secondary | ICD-10-CM

## 2016-08-24 DIAGNOSIS — Z3480 Encounter for supervision of other normal pregnancy, unspecified trimester: Secondary | ICD-10-CM

## 2016-08-24 DIAGNOSIS — Z3482 Encounter for supervision of other normal pregnancy, second trimester: Secondary | ICD-10-CM

## 2016-08-24 DIAGNOSIS — Z6791 Unspecified blood type, Rh negative: Secondary | ICD-10-CM

## 2016-08-24 NOTE — Progress Notes (Signed)
Patient states she has been feeling good, reports good fetal movement.

## 2016-08-24 NOTE — Progress Notes (Signed)
Subjective:  Kristina Cox is a 25 y.o. G3P1011 at 718w5d being seen today for ongoing prenatal care.  She is currently monitored for the following issues for this low-risk pregnancy and has Supervision of other normal pregnancy, antepartum; Rh negative, antepartum; and Pyelectasis of fetus on prenatal ultrasound on her problem list.  Patient reports no complaints.  Contractions: Not present. Vag. Bleeding: None.  Movement: Present. Denies leaking of fluid.   The following portions of the patient's history were reviewed and updated as appropriate: allergies, current medications, past family history, past medical history, past social history, past surgical history and problem list. Problem list updated.  Objective:   Vitals:   08/24/16 0835  BP: 106/66  Pulse: 97  Temp: 97.5 F (36.4 C)  Weight: 142 lb 6.4 oz (64.6 kg)    Fetal Status: Fetal Heart Rate (bpm): 142   Movement: Present     General:  Alert, oriented and cooperative. Patient is in no acute distress.  Skin: Skin is warm and dry. No rash noted.   Cardiovascular: Normal heart rate noted  Respiratory: Normal respiratory effort, no problems with respiration noted  Abdomen: Soft, gravid, appropriate for gestational age. Pain/Pressure: Absent     Pelvic:  Cervical exam deferred        Extremities: Normal range of motion.  Edema: None  Mental Status: Normal mood and affect. Normal behavior. Normal judgment and thought content.   Urinalysis:      Assessment and Plan:  Pregnancy: G3P1011 at 3018w5d  1. Supervision of other normal pregnancy, antepartum   2. Pyelectasis of fetus on prenatal ultrasound  - US MFM OB FOLLOW UP; Future  3. Rh negative, antepartum Rhogam at 28 wks  Preterm labor symptoms and general obstetric precautions including but not limited to vaginal bleeding, contractions, leaking of fluid and fetal movement were reviewed in detail with the patient. Please refer to After Visit Summary for other  counseling recommendations.  Return in about 4 weeks (around 09/21/2016) for OB visit.   Hermina StaggersMichael L Levia Waltermire, MD

## 2016-09-02 ENCOUNTER — Ambulatory Visit (HOSPITAL_COMMUNITY)
Admission: RE | Admit: 2016-09-02 | Discharge: 2016-09-02 | Disposition: A | Discharge status: Home / Self Care | Payer: Medicaid Other | Source: Ambulatory Visit | Attending: Obstetrics and Gynecology | Admitting: Obstetrics and Gynecology

## 2016-09-02 ENCOUNTER — Encounter (HOSPITAL_COMMUNITY): Payer: Self-pay

## 2016-09-02 DIAGNOSIS — O35EXX0 Maternal care for other (suspected) fetal abnormality and damage, fetal genitourinary anomalies, not applicable or unspecified: Secondary | ICD-10-CM

## 2016-09-02 DIAGNOSIS — O26899 Other specified pregnancy related conditions, unspecified trimester: Secondary | ICD-10-CM

## 2016-09-02 DIAGNOSIS — Z3480 Encounter for supervision of other normal pregnancy, unspecified trimester: Secondary | ICD-10-CM

## 2016-09-02 DIAGNOSIS — O358XX0 Maternal care for other (suspected) fetal abnormality and damage, not applicable or unspecified: Secondary | ICD-10-CM

## 2016-09-02 DIAGNOSIS — Z6791 Unspecified blood type, Rh negative: Secondary | ICD-10-CM

## 2016-09-21 ENCOUNTER — Encounter: Payer: Medicaid Other | Admitting: Obstetrics & Gynecology

## 2016-09-21 ENCOUNTER — Other Ambulatory Visit: Payer: Medicaid Other

## 2016-09-30 ENCOUNTER — Other Ambulatory Visit: Payer: Self-pay | Admitting: Obstetrics and Gynecology

## 2016-09-30 ENCOUNTER — Encounter (HOSPITAL_COMMUNITY): Payer: Self-pay

## 2016-09-30 ENCOUNTER — Ambulatory Visit (HOSPITAL_COMMUNITY)
Admission: RE | Admit: 2016-09-30 | Discharge: 2016-09-30 | Disposition: A | Discharge status: Home / Self Care | Payer: Medicaid Other | Source: Ambulatory Visit | Attending: Obstetrics and Gynecology | Admitting: Obstetrics and Gynecology

## 2016-09-30 DIAGNOSIS — O35EXX0 Maternal care for other (suspected) fetal abnormality and damage, fetal genitourinary anomalies, not applicable or unspecified: Secondary | ICD-10-CM

## 2016-09-30 DIAGNOSIS — O358XX0 Maternal care for other (suspected) fetal abnormality and damage, not applicable or unspecified: Secondary | ICD-10-CM | POA: Insufficient documentation

## 2016-09-30 DIAGNOSIS — Z3A30 30 weeks gestation of pregnancy: Secondary | ICD-10-CM | POA: Diagnosis not present

## 2016-10-11 ENCOUNTER — Other Ambulatory Visit: Payer: Medicaid Other

## 2016-10-11 DIAGNOSIS — Z3480 Encounter for supervision of other normal pregnancy, unspecified trimester: Secondary | ICD-10-CM

## 2016-10-12 LAB — CBC
Hematocrit: 39.9 % (ref 34.0–46.6)
Hemoglobin: 13.7 g/dL (ref 11.1–15.9)
MCH: 30.6 pg (ref 26.6–33.0)
MCHC: 34.3 g/dL (ref 31.5–35.7)
MCV: 89 fL (ref 79–97)
PLATELETS: 172 10*3/uL (ref 150–379)
RBC: 4.47 x10E6/uL (ref 3.77–5.28)
RDW: 13.5 % (ref 12.3–15.4)
WBC: 7.9 10*3/uL (ref 3.4–10.8)

## 2016-10-12 LAB — GLUCOSE TOLERANCE, 2 HOURS W/ 1HR
GLUCOSE, 1 HOUR: 181 mg/dL — AB (ref 65–179)
GLUCOSE, FASTING: 82 mg/dL (ref 65–91)
Glucose, 2 hour: 127 mg/dL (ref 65–152)

## 2016-10-12 LAB — HIV ANTIBODY (ROUTINE TESTING W REFLEX): HIV SCREEN 4TH GENERATION: NONREACTIVE

## 2016-10-12 LAB — RPR: RPR Ser Ql: NONREACTIVE

## 2016-10-13 ENCOUNTER — Other Ambulatory Visit: Payer: Self-pay | Admitting: Obstetrics and Gynecology

## 2016-10-13 ENCOUNTER — Encounter: Payer: Self-pay | Admitting: Obstetrics and Gynecology

## 2016-10-13 DIAGNOSIS — O24913 Unspecified diabetes mellitus in pregnancy, third trimester: Secondary | ICD-10-CM

## 2016-10-13 DIAGNOSIS — O24419 Gestational diabetes mellitus in pregnancy, unspecified control: Secondary | ICD-10-CM | POA: Insufficient documentation

## 2016-10-13 MED ORDER — ACCU-CHEK NANO SMARTVIEW W/DEVICE KIT: 1.0000 | PACK | Freq: Four times a day (QID) | 0 refills | Status: DC

## 2016-10-13 MED ORDER — ACCU-CHEK FASTCLIX LANCETS MISC: 1.0000 [IU] | Freq: Four times a day (QID) | 3 refills | Status: DC

## 2016-10-13 MED ORDER — GLUCOSE BLOOD VI STRP: ORAL_STRIP | 12 refills | Status: DC

## 2016-10-18 ENCOUNTER — Other Ambulatory Visit: Payer: Self-pay | Admitting: *Deleted

## 2016-10-18 ENCOUNTER — Telehealth: Payer: Self-pay | Admitting: *Deleted

## 2016-10-18 DIAGNOSIS — O24410 Gestational diabetes mellitus in pregnancy, diet controlled: Secondary | ICD-10-CM

## 2016-10-18 NOTE — Telephone Encounter (Signed)
-----   Message from Catalina AntiguaPeggy Constant, MD sent at 10/13/2016  1:37 PM EST ----- Please inform patient of failed glucola test and diagnosis of gdm. Please refer to diabetic educator. Meter and supplies have been e-prescribed. She needs to bring those supplies with her at her education sessions  Thanks  Gigi Gineggy

## 2016-10-18 NOTE — Telephone Encounter (Signed)
Patient notified of results and her referral

## 2016-10-19 ENCOUNTER — Encounter: Payer: Medicaid Other | Admitting: Obstetrics & Gynecology

## 2016-10-19 MED ORDER — GLUCOSE BLOOD VI STRP: ORAL_STRIP | 12 refills | Status: DC

## 2016-10-19 NOTE — Addendum Note (Signed)
Addended by: Pennie BanterSMITH, MARNI W on: 10/19/2016 10:37 AM   Modules accepted: Orders

## 2016-10-26 ENCOUNTER — Ambulatory Visit: Payer: PRIVATE HEALTH INSURANCE | Admitting: Skilled Nursing Facility1

## 2016-10-26 ENCOUNTER — Telehealth: Payer: Self-pay

## 2016-10-26 NOTE — Telephone Encounter (Signed)
Called to follow up about diabetic supplies, no answer, left vm

## 2016-10-28 ENCOUNTER — Encounter: Payer: Self-pay | Admitting: Obstetrics and Gynecology

## 2016-10-28 ENCOUNTER — Ambulatory Visit (INDEPENDENT_AMBULATORY_CARE_PROVIDER_SITE_OTHER): Payer: Medicaid Other | Admitting: Obstetrics and Gynecology

## 2016-10-28 VITALS — BP 107/66 | HR 83 | Wt 157.5 lb

## 2016-10-28 DIAGNOSIS — Z9119 Patient's noncompliance with other medical treatment and regimen: Secondary | ICD-10-CM | POA: Insufficient documentation

## 2016-10-28 DIAGNOSIS — R87810 Cervical high risk human papillomavirus (HPV) DNA test positive: Secondary | ICD-10-CM

## 2016-10-28 DIAGNOSIS — O09899 Supervision of other high risk pregnancies, unspecified trimester: Secondary | ICD-10-CM

## 2016-10-28 DIAGNOSIS — Z3480 Encounter for supervision of other normal pregnancy, unspecified trimester: Secondary | ICD-10-CM

## 2016-10-28 DIAGNOSIS — Z91199 Patient's noncompliance with other medical treatment and regimen due to unspecified reason: Secondary | ICD-10-CM

## 2016-10-28 DIAGNOSIS — R87610 Atypical squamous cells of undetermined significance on cytologic smear of cervix (ASC-US): Secondary | ICD-10-CM

## 2016-10-28 DIAGNOSIS — Z6791 Unspecified blood type, Rh negative: Secondary | ICD-10-CM

## 2016-10-28 DIAGNOSIS — O360930 Maternal care for other rhesus isoimmunization, third trimester, not applicable or unspecified: Secondary | ICD-10-CM

## 2016-10-28 DIAGNOSIS — O24419 Gestational diabetes mellitus in pregnancy, unspecified control: Secondary | ICD-10-CM

## 2016-10-28 DIAGNOSIS — O26899 Other specified pregnancy related conditions, unspecified trimester: Secondary | ICD-10-CM

## 2016-10-28 DIAGNOSIS — Z3483 Encounter for supervision of other normal pregnancy, third trimester: Secondary | ICD-10-CM

## 2016-10-28 MED ORDER — RHO D IMMUNE GLOBULIN 1500 UNITS IM SOSY: 1500.0000 [IU] | PREFILLED_SYRINGE | Freq: Once | INTRAMUSCULAR | Status: DC

## 2016-10-28 MED ORDER — RHO D IMMUNE GLOBULIN 1500 UNIT/2ML IJ SOSY
300.0000 ug | PREFILLED_SYRINGE | Freq: Once | INTRAMUSCULAR | Status: AC
  Administered 2016-10-28: 300 ug via INTRAMUSCULAR

## 2016-10-28 NOTE — Patient Instructions (Signed)
Check your blood sugars four times a day: -first thing in the morning before you eat or drink anything: you want this number less than 95 -2 hours after breakfast, lunch, and dinner: you want this number less than 120  If your numbers are consistently above these values, call us before your next visit.

## 2016-10-28 NOTE — Progress Notes (Signed)
Prenatal Visit Note Date: 10/28/2016 Clinic: [facility]  Subjective:  Doran DurandMaricia Nicole Partain is a 25 y.o. G3P1011 at 1020w0d being seen today for ongoing prenatal care.  She is currently monitored for the following issues for this high-risk pregnancy and has Supervision of other normal pregnancy, antepartum; Rh negative, antepartum; GDM (gestational diabetes mellitus); Poor compliance; and ASCUS with positive high risk HPV cervical on her problem list.  Patient reports no complaints.   Contractions: Irregular. Vag. Bleeding: None.  Movement: Present. Denies leaking of fluid.   The following portions of the patient's history were reviewed and updated as appropriate: allergies, current medications, past family history, past medical history, past social history, past surgical history and problem list. Problem list updated.  Objective:   Vitals:   10/28/16 1102  BP: 107/66  Pulse: 83  Weight: 157 lb 8 oz (71.4 kg)    Fetal Status: Fetal Heart Rate (bpm): 140s Fundal Height: 34 cm Movement: Present  Presentation: Vertex  General:  Alert, oriented and cooperative. Patient is in no acute distress.  Skin: Skin is warm and dry. No rash noted.   Cardiovascular: Normal heart rate noted  Respiratory: Normal respiratory effort, no problems with respiration noted  Abdomen: Soft, gravid, appropriate for gestational age. Pain/Pressure: Absent     Pelvic:  Cervical exam deferred        Extremities: Normal range of motion.     Mental Status: Normal mood and affect. Normal behavior. Normal judgment and thought content.   Urinalysis:      Assessment and Plan:  Pregnancy: G3P1011 at 7220w0d  1. Supervision of other normal pregnancy, antepartum Routine care. D/w pt re: BC next visit  2. Gestational diabetes mellitus (GDM) in third trimester, gestational diabetes method of control unspecified Pt missed appointments due to illness at home and she got sick too so she missed some OB  appts and her DM/nutrition visit. Pt states she's going to pick up the supplies today. D/w her qid checks and values to look out for. Will r/s for DM educator visit and order mfm growth scan and see her back in a week. Importance of good control d/w pt.  - US MFM OB FOLLOW UP; Future  3. Rh negative, antepartum Rhogam today - rho (d) immune globulin (RHIG/RHOPHYLAC) injection 300 mcg; Inject 2 mLs (300 mcg total) into the muscle once.  4. ASCUS with positive high risk HPV cervical colpo PP   Preterm labor symptoms and general obstetric precautions including but not limited to vaginal bleeding, contractions, leaking of fluid and fetal movement were reviewed in detail with the patient. Please refer to After Visit Summary for other counseling recommendations.  Return in about 1 week (around 11/04/2016) for 1wk rob, 1-2wk mfm growth u/s and needs ASAP diabetes/nutrition appointment.   Truxton Bingharlie Mccauley Diehl, MD

## 2016-11-03 ENCOUNTER — Ambulatory Visit (INDEPENDENT_AMBULATORY_CARE_PROVIDER_SITE_OTHER): Payer: BLUE CROSS/BLUE SHIELD | Admitting: Family Medicine

## 2016-11-03 VITALS — BP 119/77 | HR 86 | Wt 154.0 lb

## 2016-11-03 DIAGNOSIS — Z23 Encounter for immunization: Secondary | ICD-10-CM | POA: Diagnosis not present

## 2016-11-03 DIAGNOSIS — Z3480 Encounter for supervision of other normal pregnancy, unspecified trimester: Secondary | ICD-10-CM

## 2016-11-03 DIAGNOSIS — O26899 Other specified pregnancy related conditions, unspecified trimester: Secondary | ICD-10-CM

## 2016-11-03 DIAGNOSIS — R87810 Cervical high risk human papillomavirus (HPV) DNA test positive: Secondary | ICD-10-CM

## 2016-11-03 DIAGNOSIS — R87610 Atypical squamous cells of undetermined significance on cytologic smear of cervix (ASC-US): Secondary | ICD-10-CM

## 2016-11-03 DIAGNOSIS — O24415 Gestational diabetes mellitus in pregnancy, controlled by oral hypoglycemic drugs: Secondary | ICD-10-CM

## 2016-11-03 DIAGNOSIS — Z6791 Unspecified blood type, Rh negative: Secondary | ICD-10-CM

## 2016-11-03 MED ORDER — GLYBURIDE 2.5 MG PO TABS: 2.5000 mg | ORAL_TABLET | Freq: Every day | ORAL | 2 refills | Status: DC

## 2016-11-03 NOTE — Progress Notes (Signed)
Patient is in the office, reports good fetal movement. 

## 2016-11-03 NOTE — Patient Instructions (Signed)
Gestational Diabetes Mellitus, Self Care When you have gestational diabetes (gestational diabetes mellitus), you must keep your blood sugar (glucose) under control. You can do this with:  Nutrition.  Exercise.  Lifestyle changes.  Medicines or insulin, if needed.  Support from your doctors and others. How do I manage my blood sugar?  Check your blood sugar every day during pregnancy. Check it as often as told.  Call your doctor if your blood sugar is above your goal numbers for 2 tests in a row. Your doctor will set treatment goals for you. Try to have these blood sugars:  After not eating for a long time (after fasting): at or below 95 mg/dL (5.3 mmol/L).  After meals (postprandial):  One hour after a meal: at or below 140 mg/dL (7.8 mmol/L).  Two hours after a meal: at or below 120 mg/dL (6.7 mmol/L).  A1c (hemoglobin A1c) level: 6-6.5%. What do I need to know about high blood sugar? High blood sugar is called hyperglycemia. Know the early signs of high blood sugar. Signs include:  Feeling:  Thirsty.  Hungry.  Very tired.  Needing to pee (urinate) more than usual.  Blurry vision. What do I need to know about low blood sugar? Low blood sugar is called hypoglycemia. This is when blood sugar is at or below 70 mg/dL (3.9 mmol/L). Symptoms may include:  Feeling:  Hungry.  Worried or nervous (anxious).  Sweaty or clammy.  Confused.  Dizzy.  Sleepy.  Sick to your stomach (nauseous).  Having:  A fast heartbeat.  A headache.  A change in vision.  Jerky movements that you cannot control (seizure).  Nightmares.  Tingling or no feeling (numbness) around the mouth, lips, or tongue.  Having trouble with:  Talking.  Paying attention (concentrating).  Moving (coordination).  Sleeping.  Shaking.  Passing out (fainting).  Getting upset easily (irritability). Treating low blood sugar   To treat low blood sugar, eat or drink something sugary  right away. If you can think clearly and swallow safely, follow the 15:15 rule:  Take 15 grams of a fast-acting carb (carbohydrate). Some fast-acting carbs are:  1 tube of glucose gel.  3 sugar tablets (glucose pills).  6-8 pieces of hard candy.  4 oz (120 mL) of fruit juice.  4 oz (120 mL) regular (not diet) soda.  Check your blood sugar 15 minutes after you take the carb.  If your blood sugar is still at or below 70 mg/dL (3.9 mmol/L), take 15 grams of a carb again.  If your blood sugar does not go above 70 mg/dL (3.9 mmol/L) after 3 tries, get help right away.  After your blood sugar goes back to normal, eat a meal or a snack within 1 hour. Treating very low blood sugar  If your blood sugar is at or below 54 mg/dL (3 mmol/L), you have very low blood sugar (severe hypoglycemia). This is an emergency. Do not wait to see if the symptoms will go away. Get medical help right away. Call your local emergency services (911 in the [place] Do not drive yourself to the hospital. If you have very low blood sugar and you cannot eat or drink, you may need a glucagon shot (injection). A family member or friend should learn:  How to check your blood sugar.  How to give you a glucagon shot. Ask your doctor if you need a glucagon shot kit at home. What else is important to manage my diabetes? Medicine   Take your insulin   medicines as told.  Do not run out of insulin or medicines.  Adjust your insulin and diabetes medicines as told. Food   Make healthy food choices. These include:  Chicken, fish, egg whites, and beans.  Oats, whole wheat, bulgur, brown rice, quinoa, and millet.  Fresh fruits and vegetables.  Low-fat dairy products.  Nuts, avocado, olive oil, and canola oil.  Make an eating plan. A food specialist (dietitian) can help you.  Follow instructions from your doctor about what you cannot eat or drink.  Drink enough fluid to keep your pee (urine) clear or  pale yellow.  Eat healthy snacks between healthy meals.  Keep track of carbs you eat. Read food labels. Learn about food serving sizes.  Follow your sick day plan when you cannot eat or drink normally. Make this plan with your doctor. Activity  Exercise 30 minutes or more a day or as much as told by your doctor.  Talk with your doctor if you start a new exercise. Your doctor may need to adjust your insulin, medicines, or food. Lifestyle  Do not drink alcohol.  Do not use any tobacco products. If you need help quitting, ask your doctor.  Learn how to deal with stress. If you need help with this, ask your doctor. Body care   Stay up to date with your shots (immunizations).  Brush your teeth and gums two times a day. Floss at least one time a day.  Go to the dentist least one time every 6 months.  Stay at a healthy weight while you are pregnant. General instructions  Take over-the-counter and prescription medicines only as told by your doctor.  Ask your doctor about risks of high blood pressure in pregnancy. These are called preeclampsia and eclampsia.  Share your diabetes care plan with:  Your work or school.  People you live with.  Check your pee for ketones:  When you are sick.  As told by your doctor.  Ask your doctor:  Do I need to meet with a diabetes educator?  Where can I find a support group for people with diabetes?  Carry a card or wear jewelry that says that you have diabetes.  Keep all follow-up visits with your doctor. This is important. Care after giving birth   Have your blood sugar checked 4-12 weeks after you give birth.  Get checked for diabetes at least every 3 years. Where to find more information: To learn more about diabetes, visit:  American Diabetes Association: www.diabetes.org/diabetes-basics/gestational  Centers for Disease Control and Prevention (CDC): http://sanchez-watson.com/.pdf This information is  not intended to replace advice given to you by your health care provider. Make sure you discuss any questions you have with your health care provider. Document Released: 02/08/2016 Document Revised: 03/24/2016 Document Reviewed: 11/20/2015 Elsevier Interactive Patient Education  2017 [employer].

## 2016-11-03 NOTE — Progress Notes (Signed)
   [place] OFFICE  Subjective:  Kristina Cox is a 26 y.o. G3P1011 at 8726w6d being seen today for ongoing prenatal care.  She is currently monitored for the following issues for this high-risk pregnancy and has Supervision of other normal pregnancy, antepartum; Rh negative, antepartum; GDM (gestational diabetes mellitus); Poor compliance; and ASCUS with positive high risk HPV cervical on her problem list.  Patient reports no complaints.   . Vag. Bleeding: None.  Movement: Present. Denies leaking of fluid.  Brought in BS log:  FBS-  98/98/90/97/91/92 Breakfast - x/156/x/142/107/x Lunch- 172/98/108/109/x Dinner - 108/121/140/108/103/104  The following portions of the patient's history were reviewed and updated as appropriate: allergies, current medications, past family history, past medical history, past social history, past surgical history and problem list. Problem list updated.  Objective:   Vitals:   11/03/16 0935  BP: 119/77  Pulse: 86  Weight: 154 lb (69.9 kg)    Fetal Status: Fetal Heart Rate (bpm): 140   Movement: Present    Fundal Height: 34 cm  General:  Alert, oriented and cooperative. Patient is in no acute distress.  Skin: Skin is warm and dry. No rash noted.   Cardiovascular: Normal heart rate noted  Respiratory: Normal respiratory effort, no problems with respiration noted  Abdomen: Soft, gravid, appropriate for gestational age. Pain/Pressure: Absent     Pelvic:  Cervical exam deferred        Extremities: Normal range of motion.  Edema: None  Mental Status: Normal mood and affect. Normal behavior. Normal judgment and thought content.   Urinalysis:      Assessment and Plan:  Pregnancy: G3P1011 at 6026w6d  1. Supervision of other normal pregnancy, antepartum - TdaP and flu shot today - Starting on meds for gestational diabetes  2. Gestational diabetes mellitus (GDM) in third trimester controlled on oral hypoglycemic drug - Reviewed BS logs, improving with  diet, FBS and 2 hr PP breakfast are mildly high, will start on glyburide. - glyBURIDE (DIABETA) 2.5 MG tablet; Take 1 tablet (2.5 mg total) by mouth at bedtime.  Dispense: 30 tablet; Refill: 2  3. Rh negative, antepartum - Received at 28 wks, may need postpartum  4. ASCUS with positive high risk HPV cervical - Colpo needed postpartum  5. Encounter for immunization - Flu Vaccine QUAD 36+ mos IM  Preterm labor symptoms and general obstetric precautions including but not limited to vaginal bleeding, contractions, leaking of fluid and fetal movement were reviewed in detail with the patient. Please refer to After Visit Summary for other counseling recommendations.  Return in about 4 weeks (around 12/01/2016) for Routine OB visit.   Michaele OfferElizabeth Woodland Rhianon Zabawa, DO OB Fellow Center for [facility], [facility]

## 2016-11-10 ENCOUNTER — Encounter (HOSPITAL_COMMUNITY): Payer: Self-pay

## 2016-11-10 ENCOUNTER — Other Ambulatory Visit: Payer: Self-pay | Admitting: Obstetrics and Gynecology

## 2016-11-10 ENCOUNTER — Ambulatory Visit (HOSPITAL_COMMUNITY)
Admission: RE | Admit: 2016-11-10 | Discharge: 2016-11-10 | Disposition: A | Discharge status: Home / Self Care | Payer: Medicaid Other | Source: Ambulatory Visit | Attending: Obstetrics and Gynecology | Admitting: Obstetrics and Gynecology

## 2016-11-10 DIAGNOSIS — O24419 Gestational diabetes mellitus in pregnancy, unspecified control: Secondary | ICD-10-CM

## 2016-11-10 DIAGNOSIS — Z3A35 35 weeks gestation of pregnancy: Secondary | ICD-10-CM

## 2016-11-10 DIAGNOSIS — O24415 Gestational diabetes mellitus in pregnancy, controlled by oral hypoglycemic drugs: Secondary | ICD-10-CM | POA: Insufficient documentation

## 2016-11-10 HISTORY — DX: Gestational diabetes mellitus in pregnancy, unspecified control: O24.419

## 2016-11-11 ENCOUNTER — Encounter: Payer: Self-pay | Admitting: Obstetrics and Gynecology

## 2016-11-11 ENCOUNTER — Telehealth: Payer: Self-pay | Admitting: Obstetrics and Gynecology

## 2016-11-11 DIAGNOSIS — O3663X1 Maternal care for excessive fetal growth, third trimester, fetus 1: Secondary | ICD-10-CM | POA: Insufficient documentation

## 2016-11-11 NOTE — Telephone Encounter (Signed)
OB Telephone Note  Growth u/s results from yesterday reviewed and she is on meds so will need to do 2x/week testing. inbasket message sent to [facility] to set this up and [facility] called but the office is closed.   Pt called at [phone] called and it went straight to VM; pt called at [phone] and she answered. D/w her recommendation to start 2x/week testing. Also d/w her risk of SD given LGA fetus; thankfully she had a good sized infant last time and no issues (Problem List updated). I told her that since office is closed and no BPP yesterday that recommend she come in to MAU today preferably or tomorrow for the below tests. Also, if no word from the office by Monday to call for a ROB visit and to set up 2x/ week testing. She'll need NST 2x/ week and AFIs once per week (thursdays or fridays). Pt amenable to plan   If patient comes to MAU, she will need 1) non stress test 2) blood sugar log book review 3) hemoglobin a1c blood draw  Cornelia Copaharlie Sairah Knobloch, Jr MD Attending [facility]) 11/11/2016 Time: [phone]

## 2016-11-15 ENCOUNTER — Inpatient Hospital Stay (HOSPITAL_COMMUNITY)
Admission: AD | Admit: 2016-11-15 | Discharge: 2016-11-15 | Disposition: A | Discharge status: Home / Self Care | Payer: BLUE CROSS/BLUE SHIELD | Source: Ambulatory Visit | Attending: Obstetrics and Gynecology | Admitting: Obstetrics and Gynecology

## 2016-11-15 DIAGNOSIS — O24415 Gestational diabetes mellitus in pregnancy, controlled by oral hypoglycemic drugs: Secondary | ICD-10-CM | POA: Diagnosis present

## 2016-11-15 DIAGNOSIS — Z3A36 36 weeks gestation of pregnancy: Secondary | ICD-10-CM | POA: Insufficient documentation

## 2016-11-15 DIAGNOSIS — Z79899 Other long term (current) drug therapy: Secondary | ICD-10-CM | POA: Diagnosis not present

## 2016-11-15 DIAGNOSIS — Z87891 Personal history of nicotine dependence: Secondary | ICD-10-CM | POA: Insufficient documentation

## 2016-11-15 DIAGNOSIS — Z3689 Encounter for other specified antenatal screening: Secondary | ICD-10-CM

## 2016-11-15 NOTE — MAU Provider Note (Signed)
History     CSN: [id number]  Arrival date and time: 11/15/16 0973   First Provider Initiated Contact with Patient 11/15/16 1841      Chief Complaint  Patient presents with  . Non-stress Test   HPI Kristina Cox is a 26 y.o. G3P1011 at 49w4dwho presents for NST. Recently diagnosed with GDM during this pregnancy & is currently taking glyburide 2.5 mg daily. Patient states she was told to come here for NST. Denies abdominal pain, vaginal bleeding, or LOF. Positive fetal movement.   OB History    Gravida Para Term Preterm AB Living   3 1 1   1 1    SAB TAB Ectopic Multiple Live Births     1     1      Past Medical History:  Diagnosis Date  . Gestational diabetes     Past Surgical History:  Procedure Laterality Date  . NO PAST SURGERIES      Family History  Problem Relation Age of Onset  . Cancer Maternal Grandmother     Social History  Substance Use Topics  . Smoking status: Former Smoker    Quit date: 06/01/2014  . Smokeless tobacco: Never Used  . Alcohol use No    Allergies: No Known Allergies  Prescriptions Prior to Admission  Medication Sig Dispense Refill Last Dose  . ACCU-CHEK FASTCLIX LANCETS MISC 1 Units by Does not apply route 4 (four) times daily. 102 each 3 Taking  . Blood Glucose Monitoring Suppl (ACCU-CHEK NANO SMARTVIEW) w/Device KIT 1 Device by Does not apply route 4 (four) times daily. 1 kit 0 Taking  . glucose blood test strip Use as instructed 100 each 12 Taking  . glyBURIDE (DIABETA) 2.5 MG tablet Take 1 tablet (2.5 mg total) by mouth at bedtime. (Patient not taking: Reported on 11/10/2016) 30 tablet 2 Not Taking  . prenatal vitamin w/FE, FA (PRENATAL 1 + 1) 27-1 MG TABS tablet Take 1 tablet by mouth daily at 12 noon.   Taking    Review of Systems  Gastrointestinal: Negative.   Genitourinary: Negative.    Physical Exam   Blood pressure 115/72, pulse 96, temperature 98.2 F (36.8 C), temperature source Oral, resp. rate 20, last  menstrual period 03/04/2016, SpO2 98 %.  Physical Exam  Nursing note and vitals reviewed. Constitutional: She is oriented to person, place, and time. She appears well-developed and well-nourished. No distress.  HENT:  Head: Normocephalic and atraumatic.  Eyes: Conjunctivae are normal. Right eye exhibits no discharge. Left eye exhibits no discharge. No scleral icterus.  Neck: Normal range of motion.  Respiratory: Effort normal. No respiratory distress.  GI: Soft.  Neurological: She is alert and oriented to person, place, and time.  Skin: Skin is warm and dry. She is not diaphoretic.  Psychiatric: She has a normal mood and affect. Her behavior is normal. Judgment and thought content normal.   Fetal Tracing:  Baseline: 135 Variability: moderate Accelerations: 15x15 Decelerations: none   Toco: UI   MAU Course  Procedures No results found for this or any previous visit (from the past 24 hour(s)).  MDM Per review of Dr. PThomes Dinningnote on 11/12/15 -- pt to start antenatal testing & was to come here on that day for NST as the office was already closed. Pt states she didn't come that day d/t lack of transportation. States she called the office this morning for a follow up appointment but didn't hear back from then.  Dr. PThomes Dinningnote also  states that if she is seen in MAU for NST, she would also need A1C collected & BS log book reviewed.   Reactive NST BS log reviewed Fasting PPB PPL PPD  64-106, 3/7 abnl 66-153, 3/7 abnl 80-145, 2/7 abnl 94-164, 2/6 abnl     Assessment and Plan  A: 1. Gestational diabetes mellitus (GDM) in third trimester controlled on oral hypoglycemic drug   2. NST (non-stress test) reactive    P: Discharge home Continue glyburide as prescribed Continue documenting BS at home Call office in the morning for f/u  Jorje Guild 11/15/2016, 6:41 PM

## 2016-11-15 NOTE — MAU Note (Addendum)
G2P1 @ 36+[redacted] wksga. Sent from office for NST. Pt states missed office appt for NST due to not having a vehicle. Denies LOF or bleeding. +FM. VSS. See flow sheet for details. EFM applied.   Biweekly NST for GDM. Taking PO glyberide. Pt states BS this morning 64, lunch 80.

## 2016-11-15 NOTE — Discharge Instructions (Signed)
Gestational Diabetes Mellitus, Self Care Caring for yourself after you have been diagnosed with gestational diabetes (gestational diabetes mellitus) means keeping your blood sugar (glucose) under control with a balance of:  Nutrition.  Exercise.  Lifestyle changes.  Medicines or insulin, if necessary.  Support from your team of health care providers and others.  The following information explains what you need to know to manage your gestational diabetes at home. What do I need to do to manage my blood glucose?  Check your blood glucose every day during your pregnancy. Do this as often as told by your health care provider.  Contact your health care provider if your blood glucose is above your target for 2 tests in a row. Your health care provider will set individualized treatment goals for you. Generally, the goal of treatment is to maintain the following blood glucose levels during pregnancy:  After not eating for 8 hours (after fasting): at or below 95 mg/dL (5.3 mmol/L).  After meals (postprandial): ? One hour after a meal: at or below 140 mg/dL (7.8 mmol/L). ? Two hours after a meal: at or below 120 mg/dL (6.7 mmol/L).  A1c (hemoglobin A1c) level: 6-6.5%.  What do I need to know about hyperglycemia and hypoglycemia? What is hyperglycemia? Hyperglycemia, also called high blood glucose, occurs when blood glucose is too high. Make sure you know the early signs of hyperglycemia, such as:  Increased thirst.  Hunger.  Feeling very tired.  Needing to urinate more often than usual.  Blurry vision.  What is hypoglycemia? Hypoglycemia, also called low blood glucose, occurswith a blood glucose level at or below 70 mg/dL (3.9 mmol/L). The risk for hypoglycemia increases during or after exercise, during sleep, during illness, and when skipping meals or not eating for a long time (fasting). It is important to know the symptoms of hypoglycemia and treat it right away. Always have a  15-gram rapid-acting carbohydrate snack with you to treat low blood glucose.Family members and close friends should also know the symptoms and should understand how to treat hypoglycemia, in case you are not able to treat yourself. What are the symptoms of hypoglycemia? Hypoglycemia symptoms can include:  Hunger.  Anxiety.  Sweating and feeling clammy.  Confusion.  Dizziness or feeling light-headed.  Sleepiness.  Nausea.  Increased heart rate.  Headache.  Blurry vision.  Seizure.  Nightmares.  Tingling or numbness around the mouth, lips, or tongue.  A change in speech.  Decreased ability to concentrate.  A change in coordination.  Restless sleep.  Tremors or shakes.  Fainting.  Irritability.  How do I treat hypoglycemia?  If you are alert and able to swallow safely, follow the 15:15 rule:  Take 15 grams of a rapid-acting carbohydrate. Rapid-acting options include: ? 1 tube of glucose gel. ? 3 glucose pills. ? 6-8 pieces of hard candy. ? 4 oz (120 mL) of fruit juice. ? 4 oz (120 mL) of regular (not diet) soda.  Check your blood glucose 15 minutes after you take the carbohydrate.  If the repeat blood glucose level is still at or below 70 mg/dL (3.9 mmol/L), take 15 grams of a carbohydrate again.  If your blood glucose level does not increase above 70 mg/dL (3.9 mmol/L) after 3 tries, seek emergency medical care.  After your blood glucose level returns to normal, eat a meal or a snack within 1 hour.  How do I treat severe hypoglycemia? Severe hypoglycemia is when your blood glucose level is at or below 54 mg/dL (  3 mmol/L). Severe hypoglycemia is an emergency. Do not wait to see if the symptoms will go away. Get medical help right away. Call your local emergency services (911 in the [place] Do not drive yourself to the hospital. If you have severe hypoglycemia and you cannot eat or drink, you may need an injection of glucagon. A family member or close  friend should learn how to check your blood glucose and how to give you a glucagon injection. Ask your health care provider if you need to have an emergency glucagon injection kit available. Severe hypoglycemia may need to be treated in a hospital. The treatment may include getting glucose through an IV tube. You may also need treatment for the cause of your hypoglycemia. What else can I do to manage my gestational diabetes? Take your diabetes medicines as told  If your health care provider prescribed insulin or diabetes medicines, take them every day.  Do not run out of insulin or other diabetes medicines that you take. Plan ahead so you always have these available.  If you use insulin, adjust your dosage based on how physically active you are and what foods you eat. Your health care provider will tell you how to adjust your dosage. Make healthy food choices  The things that you eat and drink affect your blood glucose. Making good choices helps to control your diabetes and prevent other health problems. A healthy meal plan includes eating lean proteins, complex carbohydrates, fresh fruits and vegetables, low-fat dairy products, and healthy fats. Make an appointment to see a diet and nutrition specialist (registered dietitian) to help you create an eating plan that is right for you. Make sure that you:  Follow instructions from your health care provider about eating or drinking restrictions.  Drink enough fluid to keep your urine clear or pale yellow.  Eat healthy snacks between nutritious meals.  Track the carbohydrates that you eat. Do this by reading food labels and learning the standard serving sizes of foods.  Follow your sick day plan whenever you cannot eat or drink as usual. Make this plan in advance with your health care provider.  Stay active   Do at least 30 minutes of physical activity a day, or as much physical activity as your health care provider recommends during your  pregnancy. ? Doing 10 minutes of exercise 30 minutes after each meal may help to control postprandial blood glucose levels.  If you start a new exercise or activity, work with your health care provider to adjust your insulin, medicines, or food intake as needed. Make healthy lifestyle choices  Do not drink alcohol.  Do not use any tobacco products, such as cigarettes, chewing tobacco, and e-cigarettes. If you need help quitting, ask your health care provider.  Learn to manage stress. If you need help with this, ask your health care provider. Care for your body  Keep your immunizations up to date.  Brush your teeth and gums two times a day, and floss at least one time a day.  Visit your dentist at least once every 6 months.  Maintain a healthy weight during your pregnancy. General instructions   Take over-the-counter and prescription medicines only as told by your health care provider.  Talk with your health care provider about your risk for high blood pressure during pregnancy (preeclampsia or eclampsia).  Share your diabetes management plan with people in your workplace, school, and household.  Check your urine for ketones during your pregnancy when you are ill and   as told by your health care provider.  Carry a medical alert card or wear medical alert jewelry.  Ask your health care provider: ? Do I need to meet with a diabetes educator? ? Where can I find a support group for people with diabetes?  Keep all follow-up visits during your pregnancy (prenatal) and after delivery (postnatal) as told by your health care provider. This is important. Get the care that you need after delivery  Have your blood glucose level checked 4-12 weeks after delivery. This is done with an oral glucose tolerance test (OGTT).  Get screened for diabetes at least every 3 years, or as often as told by your health care provider. Where to find more information: To learn more about gestational  diabetes, visit:  American Diabetes Association (ADA): www.diabetes.org/diabetes-basics/gestational  Centers for Disease Control and Prevention (CDC): www.cdc.gov/diabetes/pubs/pdf/gestationalDiabetes.pdf  This information is not intended to replace advice given to you by your health care provider. Make sure you discuss any questions you have with your health care provider. Document Released: 02/08/2016 Document Revised: 03/24/2016 Document Reviewed: 11/20/2015 Elsevier Interactive Patient Education  2017 Elsevier Inc.  

## 2016-11-16 LAB — HEMOGLOBIN A1C
Hgb A1c MFr Bld: 4.8 % (ref 4.8–5.6)
Mean Plasma Glucose: 91 mg/dL

## 2016-11-21 ENCOUNTER — Ambulatory Visit (INDEPENDENT_AMBULATORY_CARE_PROVIDER_SITE_OTHER): Payer: PRIVATE HEALTH INSURANCE | Admitting: Obstetrics and Gynecology

## 2016-11-21 ENCOUNTER — Other Ambulatory Visit (HOSPITAL_COMMUNITY)
Admission: RE | Admit: 2016-11-21 | Discharge: 2016-11-21 | Disposition: A | Discharge status: Home / Self Care | Payer: BLUE CROSS/BLUE SHIELD | Source: Ambulatory Visit | Attending: Obstetrics and Gynecology | Admitting: Obstetrics and Gynecology

## 2016-11-21 VITALS — BP 115/75 | HR 81 | Wt 158.7 lb

## 2016-11-21 DIAGNOSIS — O26899 Other specified pregnancy related conditions, unspecified trimester: Secondary | ICD-10-CM

## 2016-11-21 DIAGNOSIS — Z113 Encounter for screening for infections with a predominantly sexual mode of transmission: Secondary | ICD-10-CM | POA: Diagnosis present

## 2016-11-21 DIAGNOSIS — Z6791 Unspecified blood type, Rh negative: Secondary | ICD-10-CM

## 2016-11-21 DIAGNOSIS — Z3A37 37 weeks gestation of pregnancy: Secondary | ICD-10-CM

## 2016-11-21 DIAGNOSIS — O24415 Gestational diabetes mellitus in pregnancy, controlled by oral hypoglycemic drugs: Secondary | ICD-10-CM

## 2016-11-21 NOTE — Progress Notes (Signed)
Patient reports she is doing well- she is using her medications. Her levels have been good- she has her book with her today.

## 2016-11-21 NOTE — Progress Notes (Signed)
Subjective:  Kristina Cox is a 26 y.o. G3P1011 at 8028w3d being seen today for ongoing prenatal care.  She is currently monitored for the following issues for this high-risk pregnancy and has Supervision of other normal pregnancy, antepartum; Rh negative, antepartum; GDM (gestational diabetes mellitus); Poor compliance; ASCUS with positive high risk HPV cervical; and Large for gestational age fetus affecting management of mother, antepartum, third trimester, fetus 1 on her problem list.  Patient reports no complaints.  Contractions: Irregular. Vag. Bleeding: None.  Movement: Present. Denies leaking of fluid.   The following portions of the patient's history were reviewed and updated as appropriate: allergies, current medications, past family history, past medical history, past social history, past surgical history and problem list. Problem list updated.  Objective:   Vitals:   11/21/16 0946  BP: 115/75  Pulse: 81  Weight: 158 lb 11.2 oz (72 kg)    Fetal Status: Fetal Heart Rate (bpm): NST   Movement: Present     General:  Alert, oriented and cooperative. Patient is in no acute distress.  Skin: Skin is warm and dry. No rash noted.   Cardiovascular: Normal heart rate noted  Respiratory: Normal respiratory effort, no problems with respiration noted  Abdomen: Soft, gravid, appropriate for gestational age. Pain/Pressure: Absent     Pelvic:  Cervical exam performed        Extremities: Normal range of motion.  Edema: None  Mental Status: Normal mood and affect. Normal behavior. Normal judgment and thought content.   Urinalysis:      Assessment and Plan:  Pregnancy: G3P1011 at 10428w3d  1. Gestational diabetes mellitus (GDM) in third trimester controlled on oral hypoglycemic drug BS in goal range except for 2-3. Appeared to be diet choice related - Fetal nonstress test; Future  2. [redacted] weeks gestation of pregnancy Labor precautions - Strep Gp B NAA - [facility]Chlamydia [facility]  3. Rh negative, antepartum Tx PP  Term labor symptoms and general obstetric precautions including but not limited to vaginal bleeding, contractions, leaking of fluid and fetal movement were reviewed in detail with the patient. Please refer to After Visit Summary for other counseling recommendations.  No Follow-up on file.   Hermina StaggersMichael L Nathon Stefanski, MD

## 2016-11-21 NOTE — Patient Instructions (Signed)

## 2016-11-22 LAB — OB RESULTS CONSOLE GBS: GBS: NEGATIVE

## 2016-11-22 LAB — GC/CHLAMYDIA PROBE AMP (~~LOC~~) NOT AT ARMC
Chlamydia: NEGATIVE
NEISSERIA GONORRHEA: NEGATIVE

## 2016-11-23 LAB — STREP GP B NAA: Strep Gp B NAA: NEGATIVE

## 2016-11-24 ENCOUNTER — Ambulatory Visit: Payer: PRIVATE HEALTH INSURANCE

## 2016-11-24 DIAGNOSIS — Z3493 Encounter for supervision of normal pregnancy, unspecified, third trimester: Secondary | ICD-10-CM

## 2016-11-28 ENCOUNTER — Inpatient Hospital Stay (HOSPITAL_COMMUNITY): Payer: Medicaid Other

## 2016-11-28 ENCOUNTER — Ambulatory Visit (INDEPENDENT_AMBULATORY_CARE_PROVIDER_SITE_OTHER): Payer: Medicaid Other | Admitting: Obstetrics and Gynecology

## 2016-11-28 ENCOUNTER — Inpatient Hospital Stay (HOSPITAL_COMMUNITY)
Admission: AD | Admit: 2016-11-28 | Discharge: 2016-11-28 | Disposition: A | Discharge status: Home / Self Care | Payer: Medicaid Other | Source: Ambulatory Visit | Attending: Obstetrics & Gynecology | Admitting: Obstetrics & Gynecology

## 2016-11-28 ENCOUNTER — Encounter (HOSPITAL_COMMUNITY): Payer: Self-pay

## 2016-11-28 ENCOUNTER — Other Ambulatory Visit: Payer: Self-pay | Admitting: Obstetrics & Gynecology

## 2016-11-28 VITALS — BP 131/87 | HR 99 | Temp 98.0°F | Wt 161.2 lb

## 2016-11-28 DIAGNOSIS — O3663X0 Maternal care for excessive fetal growth, third trimester, not applicable or unspecified: Secondary | ICD-10-CM | POA: Insufficient documentation

## 2016-11-28 DIAGNOSIS — O09299 Supervision of pregnancy with other poor reproductive or obstetric history, unspecified trimester: Secondary | ICD-10-CM

## 2016-11-28 DIAGNOSIS — Z3A38 38 weeks gestation of pregnancy: Secondary | ICD-10-CM | POA: Diagnosis present

## 2016-11-28 DIAGNOSIS — Z79899 Other long term (current) drug therapy: Secondary | ICD-10-CM | POA: Insufficient documentation

## 2016-11-28 DIAGNOSIS — O26899 Other specified pregnancy related conditions, unspecified trimester: Secondary | ICD-10-CM

## 2016-11-28 DIAGNOSIS — O288 Other abnormal findings on antenatal screening of mother: Secondary | ICD-10-CM | POA: Diagnosis not present

## 2016-11-28 DIAGNOSIS — Z6791 Unspecified blood type, Rh negative: Secondary | ICD-10-CM

## 2016-11-28 DIAGNOSIS — O401XX0 Polyhydramnios, first trimester, not applicable or unspecified: Secondary | ICD-10-CM

## 2016-11-28 DIAGNOSIS — Z3480 Encounter for supervision of other normal pregnancy, unspecified trimester: Secondary | ICD-10-CM

## 2016-11-28 DIAGNOSIS — Z87891 Personal history of nicotine dependence: Secondary | ICD-10-CM | POA: Diagnosis not present

## 2016-11-28 DIAGNOSIS — O403XX0 Polyhydramnios, third trimester, not applicable or unspecified: Secondary | ICD-10-CM

## 2016-11-28 DIAGNOSIS — O3660X0 Maternal care for excessive fetal growth, unspecified trimester, not applicable or unspecified: Secondary | ICD-10-CM | POA: Diagnosis not present

## 2016-11-28 DIAGNOSIS — Z809 Family history of malignant neoplasm, unspecified: Secondary | ICD-10-CM | POA: Insufficient documentation

## 2016-11-28 DIAGNOSIS — O24415 Gestational diabetes mellitus in pregnancy, controlled by oral hypoglycemic drugs: Secondary | ICD-10-CM

## 2016-11-28 LAB — URINALYSIS, ROUTINE W REFLEX MICROSCOPIC
Bilirubin Urine: NEGATIVE
Glucose, UA: NEGATIVE mg/dL
KETONES UR: NEGATIVE mg/dL
Leukocytes, UA: NEGATIVE
Nitrite: NEGATIVE
PH: 6 (ref 5.0–8.0)
PROTEIN: NEGATIVE mg/dL
Specific Gravity, Urine: 1.013 (ref 1.005–1.030)

## 2016-11-28 NOTE — MAU Note (Signed)
Pt sent for U/S and monitoring for non reactive NST. Denies any vaginal bleeding or abnormal discharge

## 2016-11-28 NOTE — MAU Provider Note (Signed)
History   Patient Kristina Cox is a 26 year old G3P1001 at 38 weeks 3 days here after a non-reactive NST at [facility]. Her medical history is significant for SD in her first baby with broken clavicle and current gestational diabetic recently started on glyburide. She is scheduled for induction on Friday, 12/02/2016.   CSN: [id number]  Arrival date and time: 11/28/16 1206   None     Chief Complaint  Patient presents with  . non reactive NST   HPI  Patient sent over after non-reactive NST. Patient reports positive fetal movements, denies bleeding or leaking of fluid.  OB History    Gravida Para Term Preterm AB Living   3 1 1   1 1    SAB TAB Ectopic Multiple Live Births     1     1      Past Medical History:  Diagnosis Date  . Gestational diabetes     Past Surgical History:  Procedure Laterality Date  . NO PAST SURGERIES      Family History  Problem Relation Age of Onset  . Cancer Maternal Grandmother     Social History  Substance Use Topics  . Smoking status: Former Smoker    Quit date: 06/01/2014  . Smokeless tobacco: Never Used  . Alcohol use No    Allergies: No Known Allergies  Prescriptions Prior to Admission  Medication Sig Dispense Refill Last Dose  . glyBURIDE (DIABETA) 2.5 MG tablet Take 1 tablet (2.5 mg total) by mouth at bedtime. 30 tablet 2 11/27/2016 at Unknown time  . prenatal vitamin w/FE, FA (PRENATAL 1 + 1) 27-1 MG TABS tablet Take 1 tablet by mouth at bedtime.    11/27/2016 at Unknown time  . ACCU-CHEK FASTCLIX LANCETS MISC 1 Units by Does not apply route 4 (four) times daily. 102 each 3 Taking  . Blood Glucose Monitoring Suppl (ACCU-CHEK NANO SMARTVIEW) w/Device KIT 1 Device by Does not apply route 4 (four) times daily. 1 kit 0 Taking  . glucose blood test strip Use as instructed 100 each 12 Taking    Review of Systems  Constitutional: Negative.   HENT: Negative.   Eyes: Negative.   Respiratory: Negative.   Cardiovascular: Negative.    Gastrointestinal: Negative.   Endocrine: Negative.   Genitourinary: Negative.   Musculoskeletal: Negative.   Allergic/Immunologic: Negative.   Neurological: Negative.   Hematological: Negative.   Psychiatric/Behavioral: Negative.    Physical Exam   Blood pressure 114/74, pulse 86, temperature 98.1 F (36.7 C), resp. rate 18, height 5' 2"  (1.575 m), weight 162 lb (73.5 kg), last menstrual period 03/04/2016.  Physical Exam  Constitutional: She is oriented to person, place, and time. She appears well-developed.  HENT:  Head: Normocephalic.  Neck: Normal range of motion.  Respiratory: Effort normal. No respiratory distress. She has no wheezes. She has no rales.  GI: Soft. Bowel sounds are normal.  Musculoskeletal: Normal range of motion.  Neurological: She is alert and oriented to person, place, and time.  Skin: Skin is warm and dry.    MAU Course  Procedures  MDM -NST in MAU reactive: 135 with acclerations, moderate variability and no decelerations -BPP 8/8 -AFI today is 25.63 -Patient now requesting C-section based on history of shoulder dystocia and baby's broken clavicle. Patient requesting to speak with attending; after discussion with attending patient will be scheduled for C-section on 12/02/2016.   Assessment and Plan   1. Polyhydramnios in first trimester, single or unspecified fetus  2. NST (non-stress test) nonreactive   3. LGA (large for gestational age) fetus affecting management of mother    2. Discharge with precautions on when to return to the MAU (bleeding, decreased fetal movements, leaking of fluid).  Attending will speak to OR staff to arrange C-section and patient will receive a phone call.   Mervyn Skeeters Kooistra CNM 11/28/2016, 1:55 PM

## 2016-11-28 NOTE — Patient Instructions (Signed)

## 2016-11-28 NOTE — Progress Notes (Addendum)
G3P1 @ 38+[redacted] wksga. Presents to triage sent from clinic due to hyper activity of baby "couldn't find heart beat". Pt states +FM. Denies LOF or bleeding. Pt has GDM on glyberide x1 day at night. VSS. See flow sheet for details.   Awaiting U/S for AFI and size of baby.   1339: to U/S   1500: reported off to incoming nurse.

## 2016-11-28 NOTE — Discharge Instructions (Signed)
Polyhydramnios When a woman becomes pregnant, a sac is formed around the fertilized egg (embryo) and later the growing baby (fetus). This sac is called the amniotic sac. The amniotic sac is filled with fluid. It gets bigger as the pregnancy grows. When there is too much fluid in the sac, it is called polyhydramnios. All babies born with polyhydramnios should be checked for congenital abnormalities. The amniotic fluid cushions and protects the baby. It also provides the baby with fluids and is crucial to normal development. Your baby breathes this fluid into its lungs and swallows it. This helps promote the healthy growth of the lungs and gastrointestinal tract. Amniotic fluid also helps the baby move around, helping with the normal development of muscle and bone. What are the causes? This condition is caused by:  Diabetes mellitus.  Downs syndrome, fetal abnormalities of the intestinal tract, and anencephaly (fetus without brain), all of which can prevent the fetus from swallowing the amniotic fluid.  One twins passes (transfuses) its blood into the other twin (twin-twin transfusion syndrome).  Medical illness of the mother, e.g., kidney or heart disease.  Tumor (chorioangioma) of the placenta. What are the signs or symptoms? Symptoms of this condition include:  Enlarged uterus. The size of the uterus enlarges beyond what it should be for that particular time of the pregnancy.  Pressure and discomfort. The mother may feel more pressure and discomfort than should be expected.  Quick and unexpected enlargement of the mothers stomach. How is this diagnosed? This condition is diagnosed when your health care provider measures you and notices that your uterus is beyond the size that is consistent with the time of the pregnancy.  An ultrasound is then used (abdominally or vaginally) to:  See if you are carrying twins or more.  Measure the growth of the baby.  Look for birth  defects.  Measure the amount of fluid in the amniotic sac.  Amniotic Fluid Index (AFI) measures the amount of fluid in the amniotic sac in four different areas. If there is more than 24 centimeters, you have polyhydramnios. How is this treated? This condition may be treated by:  Removing some fluid from the amniotic sac.  Taking medications that lower the fluids in your body.  Stopping the use of salt or salty foods because it causes you to keep fluid in your body (retention). Follow these instructions at home:  Keep all your prenatal visits as told by your health care provider. It is important to follow your health care providers recommendations.  Do not eat a lot of salt and salty foods.  If you have diabetes, keep it under control.  If you have heart or kidney disease, treat the disease as told by your health care provider. Contact a health care provider if:  You think your uterus has grown too fast in a short period of time.  You feel a great amount of pressure in your lower belly (pelvis) and are more uncomfortable than expected. Get help right away if:  You have a gush of fluid or are leaking fluid from your vagina.  You stop feeling the baby move.  You do not feel the baby kicking as much as usual.  You have a hard time keeping your diabetes under control.  You are having problems with your heart or kidney disease. This information is not intended to replace advice given to you by your health care provider. Make sure you discuss any questions you have with your health care provider.  [facility] will call to schedule C-section.   Document Released: 01/07/2003 Document Revised: 03/24/2016 Document Reviewed: 05/16/2013 Elsevier Interactive Patient Education  2017 [employer].

## 2016-11-28 NOTE — Progress Notes (Signed)
Subjective:  Kristina Cox is a 26 y.o. G3P1011 at 7422w3d being seen today for ongoing prenatal care.  She is currently monitored for the following issues for this high-risk pregnancy and has Supervision of other normal pregnancy, antepartum; Rh negative, antepartum; GDM (gestational diabetes mellitus); Poor compliance; ASCUS with positive high risk HPV cervical; and Large for gestational age fetus affecting management of mother, antepartum, third trimester, fetus 1 on her problem list.  Patient reports no complaints.  Contractions: Irregular. Vag. Bleeding: None.  Movement: Present. Denies leaking of fluid.   The following portions of the patient's history were reviewed and updated as appropriate: allergies, current medications, past family history, past medical history, past social history, past surgical history and problem list. Problem list updated.  Objective:   Vitals:   11/28/16 1059  BP: 131/87  Pulse: 99  Temp: 98 F (36.7 C)  Weight: 161 lb 3.2 oz (73.1 kg)    Fetal Status: Fetal Heart Rate (bpm): NST   Movement: Present     General:  Alert, oriented and cooperative. Patient is in no acute distress.  Skin: Skin is warm and dry. No rash noted.   Cardiovascular: Normal heart rate noted  Respiratory: Normal respiratory effort, no problems with respiration noted  Abdomen: Soft, gravid, appropriate for gestational age. Pain/Pressure: Present     Pelvic:  Cervical exam deferred        Extremities: Normal range of motion.     Mental Status: Normal mood and affect. Normal behavior. Normal judgment and thought content.   Urinalysis:      Assessment and Plan:  Pregnancy: G3P1011 at 2722w3d  1. Gestational diabetes mellitus (GDM) in third trimester controlled on oral hypoglycemic drug NST not reactive after 30 minutes Will send to [facility] for BPP IOL schedule for Friday  2. Supervision of other normal pregnancy, antepartum Labor precautions  3. Rh negative, antepartum Tx  PP  Term labor symptoms and general obstetric precautions including but not limited to vaginal bleeding, contractions, leaking of fluid and fetal movement were reviewed in detail with the patient. Please refer to After Visit Summary for other counseling recommendations.  Return in about 6 weeks (around 01/09/2017).   Hermina StaggersMichael L Laxmi Choung, MD

## 2016-11-29 ENCOUNTER — Encounter (HOSPITAL_COMMUNITY): Payer: Self-pay

## 2016-12-01 ENCOUNTER — Encounter (HOSPITAL_COMMUNITY)
Admission: RE | Admit: 2016-12-01 | Discharge: 2016-12-01 | Disposition: A | Discharge status: Home / Self Care | Payer: Medicaid Other | Source: Ambulatory Visit | Attending: Obstetrics and Gynecology | Admitting: Obstetrics and Gynecology

## 2016-12-01 LAB — CBC
HEMATOCRIT: 40.3 % (ref 36.0–46.0)
HEMOGLOBIN: 14 g/dL (ref 12.0–15.0)
MCH: 30.4 pg (ref 26.0–34.0)
MCHC: 34.7 g/dL (ref 30.0–36.0)
MCV: 87.4 fL (ref 78.0–100.0)
Platelets: 176 10*3/uL (ref 150–400)
RBC: 4.61 MIL/uL (ref 3.87–5.11)
RDW: 13.6 % (ref 11.5–15.5)
WBC: 8.2 10*3/uL (ref 4.0–10.5)

## 2016-12-01 LAB — BASIC METABOLIC PANEL
ANION GAP: 7 (ref 5–15)
BUN: 8 mg/dL (ref 6–20)
CHLORIDE: 106 mmol/L (ref 101–111)
CO2: 22 mmol/L (ref 22–32)
Calcium: 8.9 mg/dL (ref 8.9–10.3)
Creatinine, Ser: 0.59 mg/dL (ref 0.44–1.00)
GFR calc non Af Amer: >60 mL/min (ref >60)
Glucose, Bld: 88 mg/dL (ref 65–99)
Potassium: 3.7 mmol/L (ref 3.5–5.1)
SODIUM: 135 mmol/L (ref 135–145)

## 2016-12-01 NOTE — Patient Instructions (Signed)
[name]  12/01/2016   Your procedure is scheduled on:  12/02/2016  [facility] 0730 AM.  Pick up the phone at the desk and dial [phone].   Call this number if you have problems the morning of surgery: [phone]   Remember:   Do not eat food:After Midnight.  Do not drink clear liquids: After Midnight.  Take these medicines the morning of surgery with A SIP OF WATER: do not take Glyburide morning of surgery   Do not wear jewelry, make-up or nail polish.  Do not wear lotions, powders, or perfumes. Do not wear deodorant.  Do not shave 48 hours prior to surgery.  Do not bring valuables to the hospital.  [facility] is not   responsible for any belongings or valuables brought to the hospital.  Contacts, dentures or bridgework may not be worn into surgery.  Leave suitcase in the car. After surgery it may be brought to your room.  For patients admitted to the hospital, checkout time is 11:00 AM the day of              discharge.   Patients discharged the day of surgery will not be allowed to drive             home.  Name and phone number of your driver: na  Special Instructions:   N/A   Please read over the following fact sheets that you were given:   Surgical Site Infection Prevention

## 2016-12-02 ENCOUNTER — Encounter (HOSPITAL_COMMUNITY)
Admission: RE | Disposition: A | Discharge status: Home / Self Care | Payer: Self-pay | Source: Ambulatory Visit | Attending: Obstetrics and Gynecology

## 2016-12-02 ENCOUNTER — Inpatient Hospital Stay (HOSPITAL_COMMUNITY): Payer: Medicaid Other | Admitting: Anesthesiology

## 2016-12-02 ENCOUNTER — Encounter (HOSPITAL_COMMUNITY): Payer: Self-pay | Admitting: Certified Registered Nurse Anesthetist

## 2016-12-02 ENCOUNTER — Inpatient Hospital Stay (HOSPITAL_COMMUNITY)
Admission: RE | Admit: 2016-12-02 | Discharge: 2016-12-04 | DRG: 765 | Disposition: A | Discharge status: Home / Self Care | Payer: Medicaid Other | Source: Ambulatory Visit | Attending: Obstetrics and Gynecology | Admitting: Obstetrics and Gynecology

## 2016-12-02 DIAGNOSIS — O403XX0 Polyhydramnios, third trimester, not applicable or unspecified: Secondary | ICD-10-CM | POA: Diagnosis present

## 2016-12-02 DIAGNOSIS — Z6830 Body mass index (BMI) 30.0-30.9, adult: Secondary | ICD-10-CM

## 2016-12-02 DIAGNOSIS — E669 Obesity, unspecified: Secondary | ICD-10-CM | POA: Diagnosis present

## 2016-12-02 DIAGNOSIS — Z833 Family history of diabetes mellitus: Secondary | ICD-10-CM | POA: Diagnosis not present

## 2016-12-02 DIAGNOSIS — O3663X0 Maternal care for excessive fetal growth, third trimester, not applicable or unspecified: Secondary | ICD-10-CM | POA: Diagnosis present

## 2016-12-02 DIAGNOSIS — Z6791 Unspecified blood type, Rh negative: Secondary | ICD-10-CM | POA: Diagnosis not present

## 2016-12-02 DIAGNOSIS — O99214 Obesity complicating childbirth: Secondary | ICD-10-CM | POA: Diagnosis present

## 2016-12-02 DIAGNOSIS — O24425 Gestational diabetes mellitus in childbirth, controlled by oral hypoglycemic drugs: Secondary | ICD-10-CM | POA: Diagnosis present

## 2016-12-02 DIAGNOSIS — O26893 Other specified pregnancy related conditions, third trimester: Secondary | ICD-10-CM | POA: Diagnosis present

## 2016-12-02 DIAGNOSIS — Z87891 Personal history of nicotine dependence: Secondary | ICD-10-CM

## 2016-12-02 DIAGNOSIS — Z3A39 39 weeks gestation of pregnancy: Secondary | ICD-10-CM

## 2016-12-02 LAB — CBC
HEMATOCRIT: 37.2 % (ref 36.0–46.0)
HEMOGLOBIN: 13 g/dL (ref 12.0–15.0)
MCH: 30.6 pg (ref 26.0–34.0)
MCHC: 34.9 g/dL (ref 30.0–36.0)
MCV: 87.5 fL (ref 78.0–100.0)
Platelets: 159 10*3/uL (ref 150–400)
RBC: 4.25 MIL/uL (ref 3.87–5.11)
RDW: 13.6 % (ref 11.5–15.5)
WBC: 14.8 10*3/uL — AB (ref 4.0–10.5)

## 2016-12-02 LAB — GLUCOSE, CAPILLARY: Glucose-Capillary: 83 mg/dL (ref 65–99)

## 2016-12-02 LAB — RPR: RPR Ser Ql: NONREACTIVE

## 2016-12-02 SURGERY — Surgical Case
Anesthesia: Spinal | Site: Abdomen | Wound class: Clean Contaminated

## 2016-12-02 MED ORDER — FENTANYL CITRATE (PF) 100 MCG/2ML IJ SOLN: 25.0000 ug | INTRAMUSCULAR | Status: DC | PRN

## 2016-12-02 MED ORDER — SOD CITRATE-CITRIC ACID 500-334 MG/5ML PO SOLN
30.0000 mL | Freq: Once | ORAL | Status: AC
  Administered 2016-12-02: 30 mL via ORAL
  Filled 2016-12-02: qty 15

## 2016-12-02 MED ORDER — SENNOSIDES-DOCUSATE SODIUM 8.6-50 MG PO TABS
2.0000 | ORAL_TABLET | Freq: Every evening | ORAL | Status: DC | PRN
  Administered 2016-12-03: 2 via ORAL
  Filled 2016-12-02: qty 2

## 2016-12-02 MED ORDER — PHENYLEPHRINE 8 MG IN D5W 100 ML (0.08MG/ML) PREMIX OPTIME
INJECTION | INTRAVENOUS | Status: AC
  Filled 2016-12-02: qty 100

## 2016-12-02 MED ORDER — POLYETHYLENE GLYCOL 3350 17 G PO PACK
17.0000 g | PACK | Freq: Every day | ORAL | Status: DC
  Administered 2016-12-03: 17 g via ORAL
  Filled 2016-12-02 (×3): qty 1

## 2016-12-02 MED ORDER — NALOXONE HCL 2 MG/2ML IJ SOSY
1.0000 ug/kg/h | PREFILLED_SYRINGE | INTRAMUSCULAR | Status: DC | PRN
  Filled 2016-12-02: qty 2

## 2016-12-02 MED ORDER — DIPHENHYDRAMINE HCL 50 MG/ML IJ SOLN
12.5000 mg | INTRAMUSCULAR | Status: DC | PRN
Start: 2016-12-02 — End: 2016-12-04

## 2016-12-02 MED ORDER — NALOXONE HCL 0.4 MG/ML IJ SOLN
0.4000 mg | INTRAMUSCULAR | Status: DC | PRN
Start: 2016-12-02 — End: 2016-12-04

## 2016-12-02 MED ORDER — COCONUT OIL OIL
1.0000 "application " | TOPICAL_OIL | Status: DC | PRN
  Filled 2016-12-02: qty 120

## 2016-12-02 MED ORDER — BUPIVACAINE IN DEXTROSE 0.75-8.25 % IT SOLN
INTRATHECAL | Status: DC | PRN
  Administered 2016-12-02: 1.6 mL via INTRATHECAL

## 2016-12-02 MED ORDER — DIPHENHYDRAMINE HCL 25 MG PO CAPS: 25.0000 mg | ORAL_CAPSULE | Freq: Four times a day (QID) | ORAL | Status: DC | PRN

## 2016-12-02 MED ORDER — LACTATED RINGERS IV SOLN
INTRAVENOUS | Status: DC | PRN
  Administered 2016-12-02: 10:00:00 via INTRAVENOUS

## 2016-12-02 MED ORDER — MENTHOL 3 MG MT LOZG: 1.0000 | LOZENGE | OROMUCOSAL | Status: DC | PRN

## 2016-12-02 MED ORDER — OXYCODONE HCL 5 MG PO TABS
10.0000 mg | ORAL_TABLET | ORAL | Status: DC | PRN
  Administered 2016-12-03 – 2016-12-04 (×4): 10 mg via ORAL
  Filled 2016-12-02 (×4): qty 2

## 2016-12-02 MED ORDER — NALBUPHINE HCL 10 MG/ML IJ SOLN: 5.0000 mg | INTRAMUSCULAR | Status: DC | PRN

## 2016-12-02 MED ORDER — GLYCOPYRROLATE 0.2 MG/ML IJ SOLN
INTRAMUSCULAR | Status: DC | PRN
  Administered 2016-12-02: 0.1 mg via INTRAVENOUS

## 2016-12-02 MED ORDER — FAMOTIDINE 20 MG PO TABS
20.0000 mg | ORAL_TABLET | Freq: Once | ORAL | Status: AC
  Administered 2016-12-02: 20 mg via ORAL
  Filled 2016-12-02: qty 1

## 2016-12-02 MED ORDER — METHYLERGONOVINE MALEATE 0.2 MG PO TABS
0.2000 mg | ORAL_TABLET | ORAL | Status: AC | PRN
  Administered 2016-12-02 – 2016-12-03 (×6): 0.2 mg via ORAL
  Filled 2016-12-02 (×6): qty 1

## 2016-12-02 MED ORDER — MORPHINE SULFATE (PF) 0.5 MG/ML IJ SOLN
INTRAMUSCULAR | Status: AC
  Filled 2016-12-02: qty 10

## 2016-12-02 MED ORDER — IBUPROFEN 600 MG PO TABS: 600.0000 mg | ORAL_TABLET | Freq: Four times a day (QID) | ORAL | Status: DC

## 2016-12-02 MED ORDER — OXYTOCIN 10 UNIT/ML IJ SOLN
INTRAMUSCULAR | Status: DC | PRN
  Administered 2016-12-02: 40 [IU] via INTRAVENOUS

## 2016-12-02 MED ORDER — KETOROLAC TROMETHAMINE 30 MG/ML IJ SOLN: 30.0000 mg | Freq: Four times a day (QID) | INTRAMUSCULAR | Status: AC | PRN

## 2016-12-02 MED ORDER — PHENYLEPHRINE HCL 10 MG/ML IJ SOLN
INTRAMUSCULAR | Status: DC | PRN
  Administered 2016-12-02: 80 ug via INTRAVENOUS

## 2016-12-02 MED ORDER — PHENYLEPHRINE 8 MG IN D5W 100 ML (0.08MG/ML) PREMIX OPTIME
INJECTION | INTRAVENOUS | Status: DC | PRN
  Administered 2016-12-02: 60 ug/min via INTRAVENOUS

## 2016-12-02 MED ORDER — PHENYLEPHRINE 40 MCG/ML (10ML) SYRINGE FOR IV PUSH (FOR BLOOD PRESSURE SUPPORT)
PREFILLED_SYRINGE | INTRAVENOUS | Status: AC
  Filled 2016-12-02: qty 10

## 2016-12-02 MED ORDER — SCOPOLAMINE 1 MG/3DAYS TD PT72
1.0000 | MEDICATED_PATCH | Freq: Once | TRANSDERMAL | Status: DC
  Administered 2016-12-02: 1.5 mg via TRANSDERMAL
  Filled 2016-12-02: qty 1

## 2016-12-02 MED ORDER — ONDANSETRON HCL 4 MG/2ML IJ SOLN: 4.0000 mg | Freq: Three times a day (TID) | INTRAMUSCULAR | Status: DC | PRN

## 2016-12-02 MED ORDER — GLYCOPYRROLATE 0.2 MG/ML IJ SOLN
INTRAMUSCULAR | Status: AC
Start: 2016-12-02 — End: 2016-12-02
  Filled 2016-12-02: qty 1

## 2016-12-02 MED ORDER — FENTANYL CITRATE (PF) 100 MCG/2ML IJ SOLN
INTRAMUSCULAR | Status: DC | PRN
  Administered 2016-12-02: 10 ug via INTRATHECAL

## 2016-12-02 MED ORDER — NALBUPHINE HCL 10 MG/ML IJ SOLN: 5.0000 mg | Freq: Once | INTRAMUSCULAR | Status: DC | PRN

## 2016-12-02 MED ORDER — DIBUCAINE 1 % RE OINT: 1.0000 "application " | TOPICAL_OINTMENT | RECTAL | Status: DC | PRN

## 2016-12-02 MED ORDER — OXYTOCIN 40 UNITS IN LACTATED RINGERS INFUSION - SIMPLE MED: 2.5000 [IU]/h | INTRAVENOUS | Status: AC

## 2016-12-02 MED ORDER — ACETAMINOPHEN 500 MG PO TABS
1000.0000 mg | ORAL_TABLET | Freq: Four times a day (QID) | ORAL | Status: AC
  Administered 2016-12-02 – 2016-12-03 (×2): 1000 mg via ORAL
  Filled 2016-12-02 (×2): qty 2

## 2016-12-02 MED ORDER — SIMETHICONE 80 MG PO CHEW
80.0000 mg | CHEWABLE_TABLET | Freq: Three times a day (TID) | ORAL | Status: DC
  Administered 2016-12-02 – 2016-12-04 (×6): 80 mg via ORAL
  Filled 2016-12-02 (×7): qty 1

## 2016-12-02 MED ORDER — METHYLERGONOVINE MALEATE 0.2 MG/ML IJ SOLN: 0.2000 mg | INTRAMUSCULAR | Status: AC | PRN

## 2016-12-02 MED ORDER — LACTATED RINGERS IV SOLN
INTRAVENOUS | Status: DC
  Administered 2016-12-02 (×4): via INTRAVENOUS

## 2016-12-02 MED ORDER — KETOROLAC TROMETHAMINE 30 MG/ML IJ SOLN
30.0000 mg | Freq: Four times a day (QID) | INTRAMUSCULAR | Status: AC | PRN
  Administered 2016-12-02: 30 mg via INTRAMUSCULAR

## 2016-12-02 MED ORDER — PRENATAL MULTIVITAMIN CH
1.0000 | ORAL_TABLET | Freq: Every day | ORAL | Status: DC
  Administered 2016-12-03 – 2016-12-04 (×2): 1 via ORAL
  Filled 2016-12-02 (×3): qty 1

## 2016-12-02 MED ORDER — MORPHINE SULFATE (PF) 0.5 MG/ML IJ SOLN
INTRAMUSCULAR | Status: DC | PRN
  Administered 2016-12-02: .2 mg via INTRATHECAL

## 2016-12-02 MED ORDER — SODIUM CHLORIDE 0.9% FLUSH: 3.0000 mL | INTRAVENOUS | Status: DC | PRN

## 2016-12-02 MED ORDER — LACTATED RINGERS IV SOLN
INTRAVENOUS | Status: DC
Start: 2016-12-02 — End: 2016-12-04
  Administered 2016-12-03: via INTRAVENOUS

## 2016-12-02 MED ORDER — KETOROLAC TROMETHAMINE 30 MG/ML IJ SOLN
INTRAMUSCULAR | Status: AC
  Filled 2016-12-02: qty 1

## 2016-12-02 MED ORDER — DIPHENHYDRAMINE HCL 25 MG PO CAPS: 25.0000 mg | ORAL_CAPSULE | ORAL | Status: DC | PRN

## 2016-12-02 MED ORDER — ONDANSETRON HCL 4 MG/2ML IJ SOLN
INTRAMUSCULAR | Status: DC | PRN
  Administered 2016-12-02: 4 mg via INTRAVENOUS

## 2016-12-02 MED ORDER — CEFAZOLIN SODIUM-DEXTROSE 2-4 GM/100ML-% IV SOLN
2.0000 g | INTRAVENOUS | Status: AC
  Administered 2016-12-02: 2 g via INTRAVENOUS
  Filled 2016-12-02: qty 100

## 2016-12-02 MED ORDER — SODIUM CHLORIDE 0.9 % IR SOLN
Status: DC | PRN
Start: 2016-12-02 — End: 2016-12-02
  Administered 2016-12-02 (×2): 1

## 2016-12-02 MED ORDER — OXYTOCIN 10 UNIT/ML IJ SOLN
INTRAMUSCULAR | Status: AC
  Filled 2016-12-02: qty 4

## 2016-12-02 MED ORDER — MEPERIDINE HCL 25 MG/ML IJ SOLN: 6.2500 mg | INTRAMUSCULAR | Status: DC | PRN

## 2016-12-02 MED ORDER — WITCH HAZEL-GLYCERIN EX PADS: 1.0000 "application " | MEDICATED_PAD | CUTANEOUS | Status: DC | PRN

## 2016-12-02 MED ORDER — FENTANYL CITRATE (PF) 100 MCG/2ML IJ SOLN
INTRAMUSCULAR | Status: AC
  Filled 2016-12-02: qty 2

## 2016-12-02 MED ORDER — ONDANSETRON HCL 4 MG/2ML IJ SOLN
INTRAMUSCULAR | Status: AC
  Filled 2016-12-02: qty 2

## 2016-12-02 MED ORDER — OXYCODONE HCL 5 MG PO TABS
5.0000 mg | ORAL_TABLET | ORAL | Status: DC | PRN
  Administered 2016-12-02 – 2016-12-03 (×2): 5 mg via ORAL
  Filled 2016-12-02 (×2): qty 1

## 2016-12-02 MED ORDER — IBUPROFEN 600 MG PO TABS
600.0000 mg | ORAL_TABLET | Freq: Four times a day (QID) | ORAL | Status: DC | PRN
  Administered 2016-12-02 – 2016-12-04 (×7): 600 mg via ORAL
  Filled 2016-12-02 (×6): qty 1

## 2016-12-02 MED ORDER — ACETAMINOPHEN 325 MG PO TABS
650.0000 mg | ORAL_TABLET | ORAL | Status: DC | PRN
  Administered 2016-12-03 – 2016-12-04 (×4): 650 mg via ORAL
  Filled 2016-12-02 (×4): qty 2

## 2016-12-02 MED ORDER — NALBUPHINE HCL 10 MG/ML IJ SOLN
5.0000 mg | INTRAMUSCULAR | Status: DC | PRN
Start: 2016-12-02 — End: 2016-12-04

## 2016-12-02 MED ORDER — PROMETHAZINE HCL 25 MG/ML IJ SOLN: 6.2500 mg | INTRAMUSCULAR | Status: DC | PRN

## 2016-12-02 SURGICAL SUPPLY — 30 items
BENZOIN TINCTURE PRP APPL 2/3 (GAUZE/BANDAGES/DRESSINGS) ×3 IMPLANT
CANISTER SUCT 3000ML PPV (MISCELLANEOUS) ×3 IMPLANT
CHLORAPREP W/TINT 26ML (MISCELLANEOUS) ×3 IMPLANT
CLOSURE STERI STRIP 1/2 X4 (GAUZE/BANDAGES/DRESSINGS) ×3 IMPLANT
DERMABOND ADVANCED (GAUZE/BANDAGES/DRESSINGS) ×2
DERMABOND ADVANCED .7 DNX12 (GAUZE/BANDAGES/DRESSINGS) ×1 IMPLANT
DRSG OPSITE POSTOP 4X10 (GAUZE/BANDAGES/DRESSINGS) ×3 IMPLANT
ELECT REM PT RETURN 9FT ADLT (ELECTROSURGICAL) ×3
ELECTRODE REM PT RTRN 9FT ADLT (ELECTROSURGICAL) ×1 IMPLANT
GLOVE BIOGEL PI IND STRL 7.0 (GLOVE) ×2 IMPLANT
GLOVE BIOGEL PI IND STRL 7.5 (GLOVE) ×1 IMPLANT
GLOVE BIOGEL PI INDICATOR 7.0 (GLOVE) ×4
GLOVE BIOGEL PI INDICATOR 7.5 (GLOVE) ×2
GLOVE SKINSENSE NS SZ7.0 (GLOVE) ×2
GLOVE SKINSENSE STRL SZ7.0 (GLOVE) ×1 IMPLANT
GOWN STRL REUS W/ TWL LRG LVL3 (GOWN DISPOSABLE) ×2 IMPLANT
GOWN STRL REUS W/ TWL XL LVL3 (GOWN DISPOSABLE) ×1 IMPLANT
GOWN STRL REUS W/TWL LRG LVL3 (GOWN DISPOSABLE) ×4
GOWN STRL REUS W/TWL XL LVL3 (GOWN DISPOSABLE) ×2
NS IRRIG 1000ML POUR BTL (IV SOLUTION) ×3 IMPLANT
PACK C SECTION WH (CUSTOM PROCEDURE TRAY) ×3 IMPLANT
PAD OB MATERNITY 4.3X12.25 (PERSONAL CARE ITEMS) ×3 IMPLANT
PAD PREP 24X48 CUFFED NSTRL (MISCELLANEOUS) ×3 IMPLANT
SUT MON AB 4-0 PS1 27 (SUTURE) ×3 IMPLANT
SUT MON AB-0 CT1 36 (SUTURE) ×6 IMPLANT
SUT PLAIN 2 0 (SUTURE) ×2
SUT PLAIN ABS 2-0 CT1 27XMFL (SUTURE) ×1 IMPLANT
SUT VIC AB 0 CT1 36 (SUTURE) ×6 IMPLANT
SUT VIC AB 3-0 CT1 27 (SUTURE) ×2
SUT VIC AB 3-0 CT1 TAPERPNT 27 (SUTURE) ×1 IMPLANT

## 2016-12-02 NOTE — Progress Notes (Signed)
Fetal heart rate doppler range 134-146 at 0835am, [employer] Brown Kristina Cox, [place] 12/02/2016

## 2016-12-02 NOTE — Anesthesia Preprocedure Evaluation (Signed)
Anesthesia Evaluation  Patient identified by MRN, date of birth, ID band Patient awake    Reviewed: Allergy & Precautions, NPO status , Patient's Chart, lab work & pertinent test results  Airway Mallampati: II  TM Distance: >3 FB Neck ROM: Full    Dental  (+) Teeth Intact, Dental Advisory Given   Pulmonary former smoker,    Pulmonary exam normal breath sounds clear to auscultation       Cardiovascular negative cardio ROS Normal cardiovascular exam Rhythm:Regular Rate:Normal     Neuro/Psych negative neurological ROS  negative psych ROS   GI/Hepatic negative GI ROS, Neg liver ROS,   Endo/Other  diabetes, Gestational, Oral Hypoglycemic AgentsObesity   Renal/GU negative Renal ROS     Musculoskeletal negative musculoskeletal ROS (+)   Abdominal   Peds  Hematology negative hematology ROS (+) Plt 176k    Anesthesia Other Findings Day of surgery medications reviewed with the patient.  Reproductive/Obstetrics (+) Pregnancy                             Anesthesia Physical Anesthesia Plan  ASA: II  Anesthesia Plan: Spinal   Post-op Pain Management:    Induction:   Airway Management Planned:   Additional Equipment:   Intra-op Plan:   Post-operative Plan:   Informed Consent: I have reviewed the patients History and Physical, chart, labs and discussed the procedure including the risks, benefits and alternatives for the proposed anesthesia with the patient or authorized representative who has indicated his/her understanding and acceptance.   Dental advisory given  Plan Discussed with: CRNA, Anesthesiologist and Surgeon  Anesthesia Plan Comments: (Discussed risks and benefits of and differences between spinal and general. Discussed risks of spinal including headache, backache, failure, bleeding, infection, and nerve damage. Patient consents to spinal. Questions answered. Coagulation  studies and platelet count acceptable.)        Anesthesia Quick Evaluation

## 2016-12-02 NOTE — Op Note (Signed)
Operative Note   SURGERY DATE: 12/02/2016  PRE-OP DIAGNOSIS:  *Pregnancy @ 39/0 *GDMA2 on glyburide *Large for gestational age and polyhydramnios *History of shoulder dystocia *Patient desired cesarean delivery  POST-OP DIAGNOSIS: Same. Delivered   PROCEDURE: primary low transverse cesarean section via pfannenstiel skin incision with double layer uterine closure  SURGEON: Surgeon(s) and Role:    * Sherwood Bingharlie Julee Stoll, MD - Primary  ASSISTANT: None  ANESTHESIA: spinal  ESTIMATED BLOOD LOSS: 700mL  DRAINS: 200mL UOP via indwelling foley  TOTAL IV FLUIDS: 2000mL crystalloid  VTE PROPHYLAXIS: SCDs to bilateral lower extremities  ANTIBIOTICS: Two grams of Cefazolin were given., within 1 hour of skin incision  SPECIMENS: placenta to pathology  COMPLICATIONS: None  INDICATIONS: Patient desired  FINDINGS: No intra-abdominal adhesions were noted. Grossly normal uterus, tubes and ovaries. clear amniotic fluid, cephalic and not engaged female infant, weight 4215gm, APGARs 8/9, intact placenta.  PROCEDURE IN DETAIL: The patient was taken to the operating room where anesthesia was administered and normal fetal heart tones were confirmed. She was then prepped and draped in the normal fashion in the dorsal supine position with a leftward tilt.  After a time out was performed, a pfannensteil  skin incision was made with the scalpel and carried through to the underlying layer of fascia. The fascia was then incised at the midline and this incision was extended laterally with the mayo scissors. Attention was turned to the superior aspect of the fascial incision which was grasped with the kocher clamps x 2, tented up and the rectus muscles were dissected off with the bovie. In a similar fashion the inferior aspect of the fascial incision was grasped with the kocher clamps, tented up and the rectus muscles dissected off with the mayo scissors. The rectus muscles were then separated in the midline and  the peritoneum was entered bluntly. The bladder blade was inserted and the vesicouterine peritoneum was identified, tented up and entered with the metzenbaum scissors. This incision was extended laterally and the bladder flap was created digitally. The bladder blade was reinserted.  A low transverse hysterotomy was made with the scalpel until the endometrial cavity was breached and the amniotic sac ruptured with the Allis clamp, yielding clear amniotic fluid. This incision was extended bluntly and the infant's head, shoulders and body were delivered atraumatically.The cord was clamped x 2 and cut, and the infant was handed to the awaiting pediatricians, after delayed cord clamping was done.  The placenta was then gradually expressed from the uterus and then the uterus was exteriorized and cleared of all clots and debris. The hysterotomy was repaired with a running suture of 1-0 monocryl. A second imbricating layer of 1-0 monocryl suture was then placed.   The uterus and adnexa were then returned to the abdomen, and the hysterotomy and all operative sites were reinspected and excellent hemostasis was noted after irrigation and suction of the abdomen with warm saline.  The peritoneum was closed with a running stitch of 3-0 Vicryl. The fascia was reapproximated with 0 Vicryl in a simple running fashion bilaterally. The subcutaneous layer was then reapproximated with interrupted sutures of 2-0 plain gut, and the skin was then closed with 4-0 monocryl, in a subcuticular fashion.  The patient  tolerated the procedure well. Sponge, lap, needle, and instrument counts were correct x 2. The patient was transferred to the recovery room awake, alert and breathing independently in stable condition.  Cornelia Copaharlie Minnie Shi, Jr. MD Attending Center for [facility](Faculty Practice)

## 2016-12-02 NOTE — Progress Notes (Signed)
Spoke with Philipp DeputyKim Shaw, CNM at 13:30 to report trickling with fundal massage. Per Philipp DeputyKim Shaw, CNM, start methergine series. Spoke with Dr Vergie LivingPickens at 14:20 and reporting increased amount of bleeding with fundal massage. Fundus firm and bleeding stops after massage, per Dr Vergie LivingPickens, continue with methergine, update him if bleeding increases and check CBC at 16:00 today. Sherald BargeMatthews, Geralynn Capri L

## 2016-12-02 NOTE — Anesthesia Procedure Notes (Signed)
Spinal  Patient location during procedure: OR Start time: 12/02/2016 9:22 AM End time: 12/02/2016 9:23 AM Staffing Anesthesiologist: Kristina Cox, STEPHEN EDWARD Performed: anesthesiologist  Preanesthetic Checklist Completed: patient identified, surgical consent, pre-op evaluation, timeout performed, IV checked, risks and benefits discussed and monitors and equipment checked Spinal Block Patient position: sitting Prep: site prepped and draped and DuraPrep Patient monitoring: continuous pulse ox and blood pressure Approach: midline Location: L3-4 Needle Needle type: Pencan  Needle gauge: 25 G Needle length: 9 cm Assessment Sensory level: T6 Additional Notes Functioning IV was confirmed and monitors were applied. Sterile prep and drape, including hand hygiene, mask and sterile gloves were used. The patient was positioned and the spine was prepped. The skin was anesthetized with lidocaine.  Free flow of clear CSF was obtained prior to injecting local anesthetic into the CSF.  The spinal needle aspirated freely following injection.  The needle was carefully withdrawn.  The patient tolerated the procedure well. Consent was obtained prior to procedure with all questions answered and concerns addressed. Risks including but not limited to bleeding, infection, nerve damage, paralysis, failed block, inadequate analgesia, allergic reaction, high spinal, itching and headache were discussed and the patient wished to proceed.   Kristina AranStephen Turk, MD

## 2016-12-02 NOTE — H&P (Addendum)
Obstetrics Admission History & Physical  12/02/2016 - 8:54 AM Primary OBGYN: -[place]  Chief Complaint: scheduled elective c-section  History of Present Illness  26 y.o. G3P1011 @ [redacted]w[redacted]d with the above CC. Pregnancy complicated by: GLAGT3 LGA fetus, h/o shoulder dystocia with broken infant clavicle, Rh neg  Ms. MMordecai Rasmussenstates that she's doing well and denies any labor s/s or decreased FM.   Review of Systems: as noted in the History of Present Illness.  PMHx:  Past Medical History:  Diagnosis Date  . Gestational diabetes    PSHx:  Past Surgical History:  Procedure Laterality Date  . NO PAST SURGERIES     Medications:  Prescriptions Prior to Admission  Medication Sig Dispense Refill Last Dose  . ACCU-CHEK FASTCLIX LANCETS MISC 1 Units by Does not apply route 4 (four) times daily. 102 each 3 Taking  . Blood Glucose Monitoring Suppl (ACCU-CHEK NANO SMARTVIEW) w/Device KIT 1 Device by Does not apply route 4 (four) times daily. 1 kit 0 Taking  . glucose blood test strip Use as instructed 100 each 12 Taking  . glyBURIDE (DIABETA) 2.5 MG tablet Take 1 tablet (2.5 mg total) by mouth at bedtime. 30 tablet 2 11/27/2016 at Unknown time  . prenatal vitamin w/FE, FA (PRENATAL 1 + 1) 27-1 MG TABS tablet Take 1 tablet by mouth at bedtime.    11/27/2016 at Unknown time     Allergies: has No Known Allergies. OBHx:  OB History  Gravida Para Term Preterm AB Living  _0 SAB TAB Ectopic Multiple Live Births    1     1    # Outcome Date GA Lbr Len/2nd Weight Sex Delivery Anes PTL Lv  3 Current           2 TAB 10/2015          1 Term 11/24/14 356w3d8 lb 11 oz (3.941 kg) F Vag-Spont EPI N LIV     Complications: Shoulder Dystocia               FHx:  Family History  Problem Relation Age of Onset  . Cancer Maternal Grandmother   . Diabetes Maternal Grandmother    Soc Hx:  Social History   Social History  . Marital status: Single    Spouse name: N/A  . Number of children:  N/A  . Years of education: N/A   Occupational History  . Not on file.   Social History Main Topics  . Smoking status: Former Smoker    Quit date: 06/01/2014  . Smokeless tobacco: Never Used  . Alcohol use No  . Drug use: No  . Sexual activity: Yes   Other Topics Concern  . Not on file   Social History Narrative  . No narrative on file    Objective    Current Vital Signs 24h Vital Sign Ranges  T 98.7 F (37.1 C) Temp  Avg: 98.7 F (37.1 C)  Min: 98.7 F (37.1 C)  Max: 98.7 F (37.1 C)  BP (!) 96/57 BP  Min: 96/57  Max: 96/57  HR 73 Pulse  Avg: 73  Min: 73  Max: 73  RR 18 Resp  Avg: 18  Min: 18  Max: 18  SaO2 98 % Not Delivered SpO2  Avg: 98 %  Min: 98 %  Max: 98 %       24 Hour I/O Current Shift I/O  Time Ins Outs No intake/output data  recorded. No intake/output data recorded.   General: Well nourished, well developed female in no acute distress.  Skin:  Warm and dry.  Cardiovascular: S1, S2 normal, no murmur, rub or gallop, regular rate and rhythm Respiratory:  Clear to auscultation bilateral. Normal respiratory effort Abdomen: gravid, nttp Neuro/Psych:  Normal mood and affect.   Labs  O NEG BMP Latest Ref Rng & Units 12/01/2016  Glucose 65 - 99 mg/dL 88  BUN 6 - 20 mg/dL 8  Creatinine 0.44 - 1.00 mg/dL 0.59  Sodium 135 - 145 mmol/L 135  Potassium 3.5 - 5.1 mmol/L 3.7  Chloride 101 - 111 mmol/L 106  CO2 22 - 32 mmol/L 22  Calcium 8.9 - 10.3 mg/dL 8.9    Recent Labs Lab 12/01/16 1055  WBC 8.2  HGB 14.0  HCT 40.3  PLT 176   BS 83  Radiology Anterior placenta. 1/29 with poly @ 25 1/11 >90%, 3400gm, large AC  Perinatal info  O NEG, +rhogam ab/ Rubella  Immune / Varicella Immune/RPR neg/HIV negative/HepB Surf Ag negative/TDaP:UTD /flu UTDpap ascus/hpv pos/  Assessment & Plan  Pt doing well *Pregnancy: fetal status reassuring *high risk pregnancy: d/w her r/b/a with TOL vs elective c-section but she would like to proceed with elective c-section.  Has been NPO p MN. Can proceed when OR is read *GBS: neg *GDMa2: normal BS this AM. Will get fasting tomorrow and decide if needs to continue to follow *Rh neg: rhogam PP PRN *ASCUS/HPV pos: needs PP colpo at 6wk visit  Durene Romans MD Attending Center for [facility])

## 2016-12-02 NOTE — Anesthesia Postprocedure Evaluation (Signed)
Anesthesia Post Note  Patient: Kristina Cox  Procedure(s) Performed: Procedure(s) (LRB): CESAREAN SECTION (N/A)  Patient location during evaluation: PACU Anesthesia Type: Spinal Level of consciousness: oriented and awake and alert Pain management: pain level controlled Vital Signs Assessment: post-procedure vital signs reviewed and stable Respiratory status: spontaneous breathing, respiratory function stable and patient connected to nasal cannula oxygen Cardiovascular status: blood pressure returned to baseline and stable Postop Assessment: no headache, no backache, spinal receding, no signs of nausea or vomiting and patient able to bend at knees Anesthetic complications: no        Last Vitals:  Vitals:   12/02/16 1415 12/02/16 1509  BP: (!) 97/50 (!) 102/59  Pulse: 61 (!) 58  Resp: 16 16  Temp: 36.4 C 36.7 C    Last Pain:  Vitals:   12/02/16 1509  TempSrc: Oral  PainSc:    Pain Goal: Patients Stated Pain Goal: 3 (12/02/16 1415)               Cecile HearingStephen Edward Turk

## 2016-12-02 NOTE — Transfer of Care (Addendum)
Immediate Anesthesia Transfer of Care Note  Patient: Kristina Cox  Procedure(s) Performed: Procedure(s): CESAREAN SECTION (N/A)  Patient Location: PACU  Anesthesia Type:Spinal  Level of Consciousness: awake, alert , oriented and patient cooperative  Airway & Oxygen Therapy: Patient Spontanous Breathing  Post-op Assessment: Report given to RN and Post -op Vital signs reviewed and stable  Post vital signs: Reviewed and stable 90/52 BP, 62 NSR, 97-99% spo2 on RA (Virgie available but not currently in use on arrival to PACU)  Last Vitals:  Vitals:   12/02/16 0834 12/02/16 0900  BP: (!) 96/57 120/67  Pulse: 73   Resp: 18   Temp:      Last Pain:  Vitals:   12/02/16 0826  TempSrc: Oral         Complications: No apparent anesthesia complications

## 2016-12-02 NOTE — Lactation Note (Signed)
This note was copied from a baby's chart. Lactation Consultation Note Initial visit at 2 hours of age.  Mom has c/s for suspected LGA, baby weight 9#4oz at 591w0d.  Mom with GDM and taking glyburide.  Baby is in the nursery for dusky at delivery with heat shield, monitoring and then oxyhood. LC visited mom in PACU.  Mom has limited experience with older child breast feeding for a few weeks mom was pumping due to NICU stay, baby with medical complications. Mom has small semi full breast with everted nipples and small diameter areola.  Mom has easily compressed breasts with immediate flow of colostrum.  LC was able to collect 4mls in colostrum container.  Mom attempted hand expression with instructions and my need more practice.  LC advised mom to continue to work on hand expression frequently (1-2hrs) to collect for baby during separation.  Lc discussed using DEBP when mom arrives in post partum room.  LC discussed plan of care with MBU, RN Judeth CornfieldStephanie. Mom eager for baby to have her EBM and requests no bottle nipples at this time due to difficulty she had with older child and then had to use NS.  Moms breast anatomy does not suggest need for NS at this time.   [facility] is 29 and Lc provided 4mls of colostrum in container for nursery nurse to feed to baby.  Mom has colostrum container to continue to work on hand expressing.    [facility] resources given and discussed.   Early newborn behavior discussed.    Mom to call for assist as needed.    Patient Name: Kristina Cox JYNWG'NToday's Date: 12/02/2016 Reason for consult: Initial assessment   Maternal Data Formula Feeding for Exclusion: No Has patient been taught Hand Expression?: Yes Does the patient have breastfeeding experience prior to this delivery?: Yes  Feeding    LATCH Score/Interventions                Intervention(s): Breastfeeding basics reviewed     Lactation Tools Discussed/Used      Consult Status Consult Status: Follow-up Date: 12/03/16 Follow-up type: In-patient    Kristina Cox 12/02/2016, 11:53 AM

## 2016-12-03 LAB — CBC
HCT: 37.9 % (ref 36.0–46.0)
Hemoglobin: 13.1 g/dL (ref 12.0–15.0)
MCH: 30.5 pg (ref 26.0–34.0)
MCHC: 34.6 g/dL (ref 30.0–36.0)
MCV: 88.1 fL (ref 78.0–100.0)
PLATELETS: 143 10*3/uL — AB (ref 150–400)
RBC: 4.3 MIL/uL (ref 3.87–5.11)
RDW: 13.9 % (ref 11.5–15.5)
WBC: 12.1 10*3/uL — AB (ref 4.0–10.5)

## 2016-12-03 LAB — GLUCOSE, CAPILLARY: GLUCOSE-CAPILLARY: 79 mg/dL (ref 65–99)

## 2016-12-03 NOTE — Lactation Note (Signed)
This note was copied from a baby's chart. Lactation Consultation Note  Patient Name: Kristina Cox ZOXWR'UToday's Date: 12/03/2016  Follow up visit made.  Mom states baby is nursing frequently and well.  No questions or concerns at present.  Discussed cluster feeding.  Instructed to feed with any cue and to call with concerns/assist.   Maternal Data    Feeding Feeding Type: Breast Fed Length of feed: 25 min  LATCH Score/Interventions                      Lactation Tools Discussed/Used     Consult Status      Huston FoleyMOULDEN, Ronav Furney S 12/03/2016, 3:30 PM

## 2016-12-03 NOTE — Progress Notes (Signed)
Subjective: Postpartum Day #1: Cesarean Delivery Patient reports tolerating PO and no problems voiding.  Breastfeeding going well; plans condoms for contraception. Has been on methergine series; bldg stable.  Objective: Vital signs in last 24 hours: Temp:  [97.6 F (36.4 C)-98.1 F (36.7 C)] 98 F (36.7 C) (02/03 0829) Pulse Rate:  [51-65] 56 (02/03 0829) Resp:  [15-18] 18 (02/03 0829) BP: (90-117)/(48-73) 117/68 (02/03 0829) SpO2:  [95 %-100 %] 97 % (02/03 0320)  Physical Exam:  General: alert and cooperative Lochia: appropriate Uterine Fundus: firm Incision: honeycomb dsg intact; marked and unchanged DVT Evaluation: No evidence of DVT seen on physical exam.   Recent Labs  12/02/16 1526 12/03/16 0520  HGB 13.0 13.1  HCT 37.2 37.9   CBG: 79  Assessment/Plan: Status post Cesarean section. Doing well postoperatively.  Continue current care. Anticipate d/c in AM on 12/04/16.  Cam HaiSHAW, Sibbie Flammia CNM 12/03/2016, 9:48 AM

## 2016-12-03 NOTE — Addendum Note (Signed)
Addendum  created 12/03/16 [id number] by Junious SilkMelinda Lorelle Macaluso, CRNA   Sign clinical note

## 2016-12-03 NOTE — Anesthesia Postprocedure Evaluation (Signed)
Anesthesia Post Note  Patient: Kristina Cox  Procedure(s) Performed: Procedure(s) (LRB): CESAREAN SECTION (N/A)  Patient location during evaluation: Mother Baby Anesthesia Type: Spinal Level of consciousness: oriented and awake and alert Pain management: pain level controlled Vital Signs Assessment: post-procedure vital signs reviewed and stable Respiratory status: spontaneous breathing, respiratory function stable and patient connected to nasal cannula oxygen Cardiovascular status: blood pressure returned to baseline and stable Postop Assessment: no headache and no backache Anesthetic complications: no        Last Vitals:  Vitals:   12/03/16 0320 12/03/16 0829  BP: (!) 104/48 117/68  Pulse: 60 (!) 56  Resp: 16 18  Temp: 36.6 C 36.7 C    Last Pain:  Vitals:   12/03/16 0832  TempSrc:   PainSc: 7    Pain Goal: Patients Stated Pain Goal: 0 (12/02/16 1750)               Junious SilkGILBERT,Selma Mink

## 2016-12-03 NOTE — Discharge Instructions (Signed)

## 2016-12-04 MED ORDER — IBUPROFEN 600 MG PO TABS: 600.0000 mg | ORAL_TABLET | Freq: Four times a day (QID) | ORAL | 0 refills | Status: AC | PRN

## 2016-12-04 MED ORDER — OXYCODONE HCL 5 MG PO TABS: 5.0000 mg | ORAL_TABLET | ORAL | 0 refills | Status: AC | PRN

## 2016-12-04 NOTE — Discharge Summary (Signed)
OB Discharge Summary  Patient Name: Kristina Cox DOB: 1991/08/21 MRN: [id number]  Date of admission: 12/02/2016 Delivering MD: Aletha Halim   Date of discharge: 12/04/2016  Admitting diagnosis: Primary CS [phone] Intrauterine pregnancy: [redacted]w[redacted]d    Secondary diagnosis:Active Problems:   Delivery by elective cesarean section  Additional problems:none     Discharge diagnosis: Term Pregnancy Delivered and GDM A2                                                                     Post partum procedures:n/a  Augmentation: n/a  Complications: None  Hospital course:  Sceduled C/S   26y.o. yo [id number] admitted to the hospital 12/02/2016 for scheduled cesarean section with the following indication:Elective Repeat.  Membrane Rupture Time/Date: 9:47 AM ,12/02/2016   Patient delivered a Viable infant.12/02/2016  Details of operation can be found in separate operative note.  Pateint had an uncomplicated postpartum course.  She is ambulating, tolerating a regular diet, passing flatus, and urinating well. Patient is discharged home in stable condition on  12/04/16         Physical exam  Vitals:   12/03/16 0320 12/03/16 0829 12/03/16 1814 12/04/16 0533  BP: (!) 104/48 117/68 (!) 104/51 (!) 100/55  Pulse: 60 (!) 56 62 60  Resp: _0 Temp: 97.8 F (36.6 C) 98 F (36.7 C) 98.1 F (36.7 C) 98.4 F (36.9 C)  TempSrc: Oral Oral Oral Axillary  SpO2: 97%  100%   Weight:      Height:       General: alert, cooperative and no distress Lochia: appropriate Uterine Fundus: firm Incision: Healing well with no significant drainage, No significant erythema, Dressing is clean, dry, and intact DVT Evaluation: No evidence of DVT seen on physical exam. Labs: Lab Results  Component Value Date   WBC 12.1 (H) 12/03/2016   HGB 13.1 12/03/2016   HCT 37.9 12/03/2016   MCV 88.1 12/03/2016   PLT 143 (L) 12/03/2016   CMP Latest Ref Rng & Units 12/01/2016  Glucose 65 - 99 mg/dL 88   BUN 6 - 20 mg/dL 8  Creatinine 0.44 - 1.00 mg/dL 0.59  Sodium 135 - 145 mmol/L 135  Potassium 3.5 - 5.1 mmol/L 3.7  Chloride 101 - 111 mmol/L 106  CO2 22 - 32 mmol/L 22  Calcium 8.9 - 10.3 mg/dL 8.9    Discharge instruction: per After Visit Summary and "Baby and Me Booklet".  After Visit Meds:  Allergies as of 12/04/2016   No Known Allergies     Medication List    STOP taking these medications   ACCU-CHEK FASTCLIX LANCETS Misc   ACCU-CHEK NANO SMARTVIEW w/Device Kit   glucose blood test strip   glyBURIDE 2.5 MG tablet Commonly known as:  DIABETA     TAKE these medications   ibuprofen 600 MG tablet Commonly known as:  ADVIL,MOTRIN Take 1 tablet (600 mg total) by mouth every 6 (six) hours as needed for mild pain or cramping.   oxyCODONE 5 MG immediate release tablet Commonly known as:  Oxy IR/ROXICODONE Take 1 tablet (5 mg total) by mouth every 4 (four) hours as needed (pain scale 4-7).   prenatal  vitamin w/FE, FA 27-1 MG Tabs tablet Take 1 tablet by mouth at bedtime.       Diet: routine diet  Activity: Advance as tolerated. Pelvic rest for 6 weeks.   Outpatient follow up:6 weeks Follow up Appt:No future appointments. Follow up visit: No Follow-up on file.  Postpartum contraception: Undecided  Newborn Data: Live born female  Birth Weight: 9 lb 4.7 oz (4215 g) APGAR: 8, 9  Baby Feeding: Bottle and Breast Disposition:home with mother   12/04/2016 Marie Lawson, CNM  

## 2016-12-04 NOTE — Progress Notes (Signed)
Patient ID: Kristina Cox, female   DOB: 07/13/1991, 26 y.o.   MRN: [id number]  POSTPARTUM PROGRESS NOTE  Post Op/Partum Day #2 Subjective:  Kristina LoanMaricia N Orzechowski is a 26 y.o. [id number] 5972w0d s/p PLTCS.  No acute events overnight.  Pt denies problems with ambulating, voiding or po intake.  She denies nausea or vomiting.  Pain is well controlled.  She has not had flatus. She has not had bowel movement.  Lochia Small. Discussed with patient unable to be discharged until flatus occurs.   Objective: Blood pressure (!) 100/55, pulse 60, temperature 98.4 F (36.9 C), temperature source Axillary, resp. rate 16, height 5\' 2"  (1.575 m), weight 165 lb (74.8 kg), last menstrual period 03/04/2016, SpO2 100 %, unknown if currently breastfeeding.  Physical Exam:  General: alert, cooperative and no distress Lochia:normal flow Chest: CTAB Heart: RRR no m/r/g Abdomen: +BS, soft, nontender, Incision is clean/dry/intact Uterine Fundus: firm, below umbilicus DVT Evaluation: No calf swelling or tenderness Extremities: Trace edema   Recent Labs  12/02/16 1526 12/03/16 0520  HGB 13.0 13.1  HCT 37.2 37.9    Assessment/Plan:  ASSESSMENT: Kristina LoanMaricia N Kobus is a 26 y.o. [id number] 5172w0d s/p PLTCS  Plan for discharge tomorrow, Breastfeeding and Contraception considering LARC  Possible d/c today if able to have flatus   LOS: 2 days   Jen MowElizabeth Mumaw, DO [facility]  12/04/2016, 10:19 AM

## 2016-12-05 LAB — TYPE AND SCREEN
ABO/RH(D): O NEG
Antibody Screen: POSITIVE
DAT, IGG: NEGATIVE
UNIT DIVISION: 0
UNIT DIVISION: 0

## 2016-12-09 ENCOUNTER — Telehealth: Payer: Self-pay

## 2016-12-09 NOTE — Telephone Encounter (Signed)
TC from pt c/s 5 days ago, requesting rf on Oxycodone. She was given 30 tabs and has been taking them every 4 hrs. Advised pt to take the IB 600 mg q 6 hrs and take the Oxycodone only when needed. Oxycodone denied rf.

## 2016-12-27 ENCOUNTER — Encounter (HOSPITAL_COMMUNITY): Payer: Self-pay | Admitting: Obstetrics and Gynecology

## 2017-01-25 ENCOUNTER — Ambulatory Visit: Payer: BLUE CROSS/BLUE SHIELD | Admitting: Obstetrics and Gynecology

## 2017-02-06 ENCOUNTER — Ambulatory Visit: Payer: BLUE CROSS/BLUE SHIELD | Admitting: Obstetrics and Gynecology

## 2018-07-10 IMAGING — US US MFM OB FOLLOW-UP
1 series · 14 of 28 positions shown · non-contrast
Comparison: none

[Series 1: us mfm ob follow-up · 57 acquisitions, 14 frames shown]
[im 3/57]
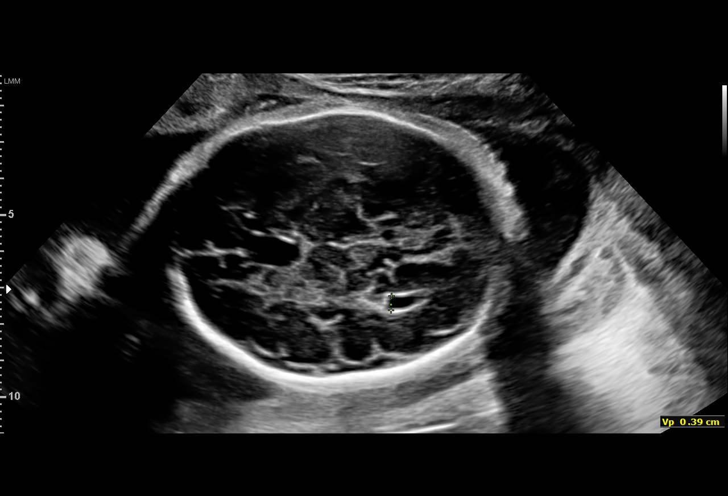
[im 7/57]
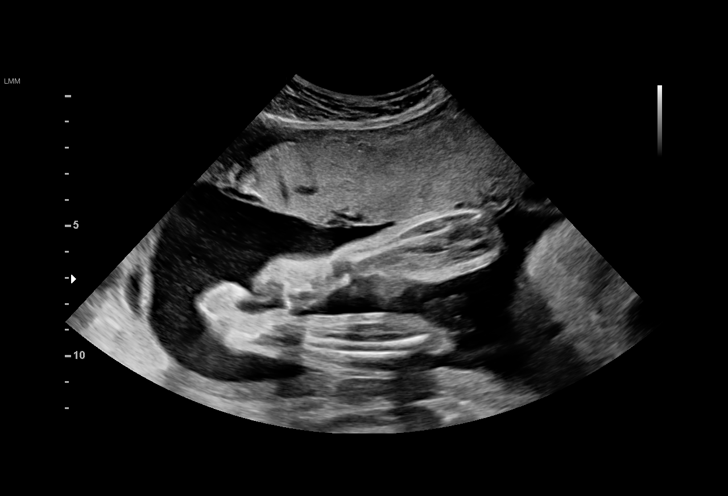
[im 11/57]
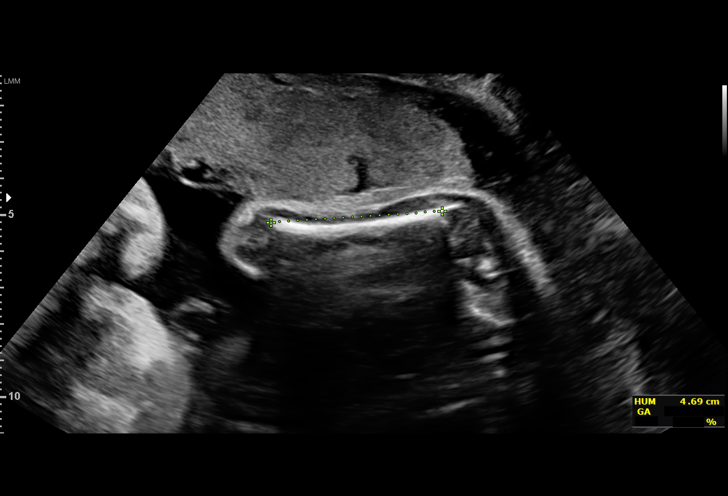
[im 15/57]
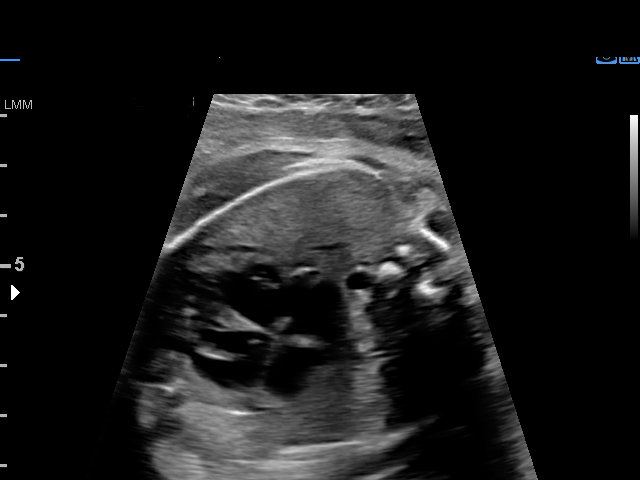
[im 19/57]
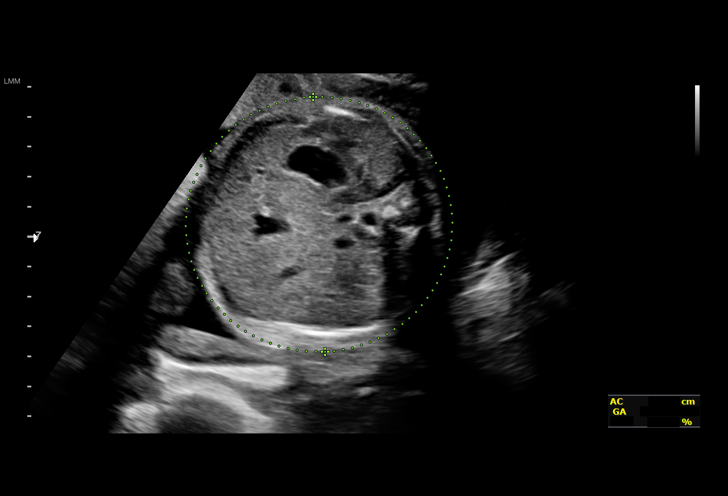
[im 23/57]
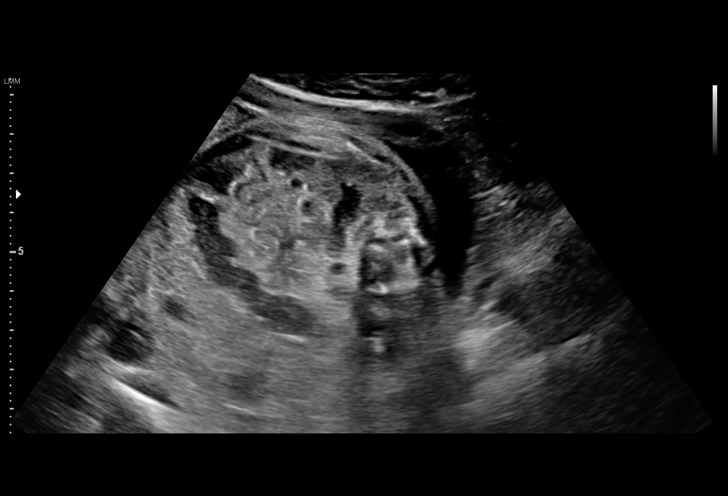
[im 27/57]
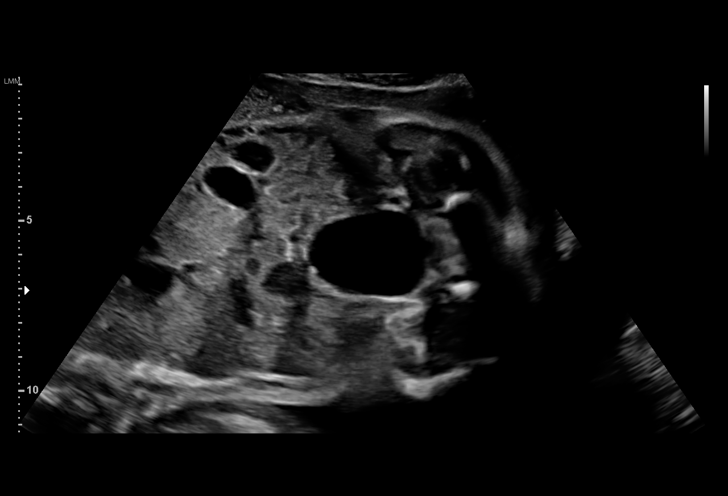
[im 32/57]
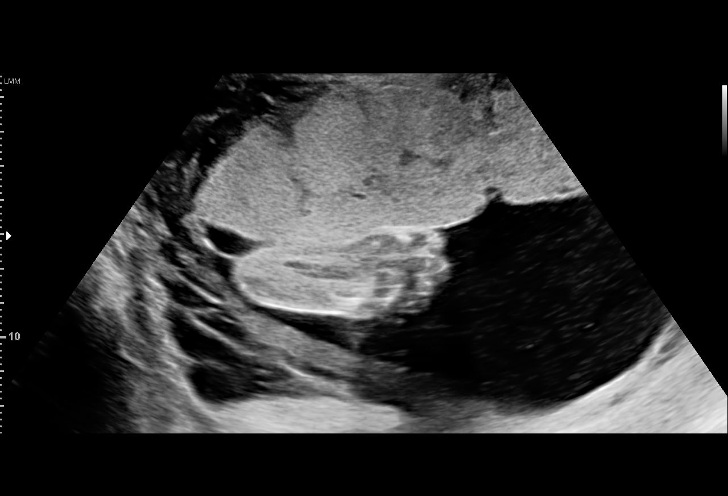
[im 36/57]
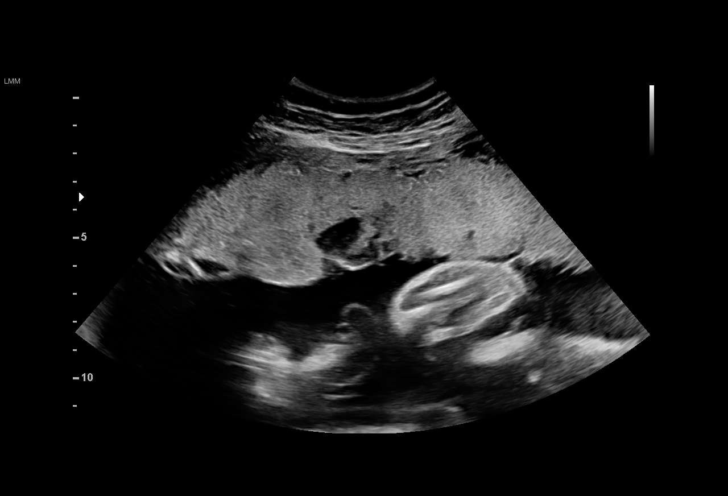
[im 40/57]
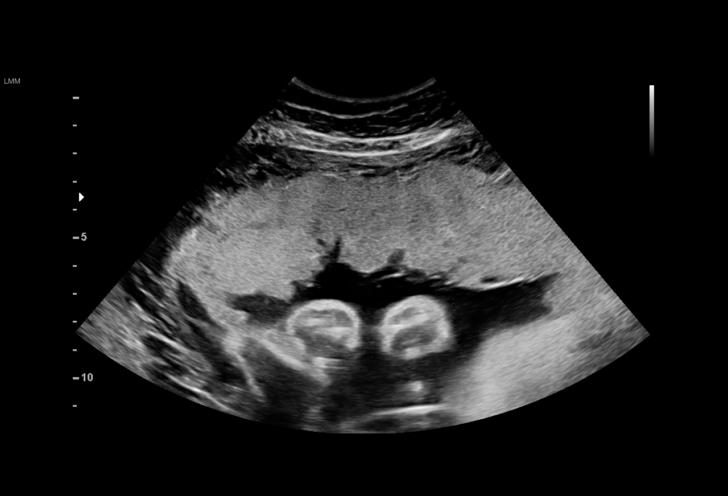
[im 44/57]
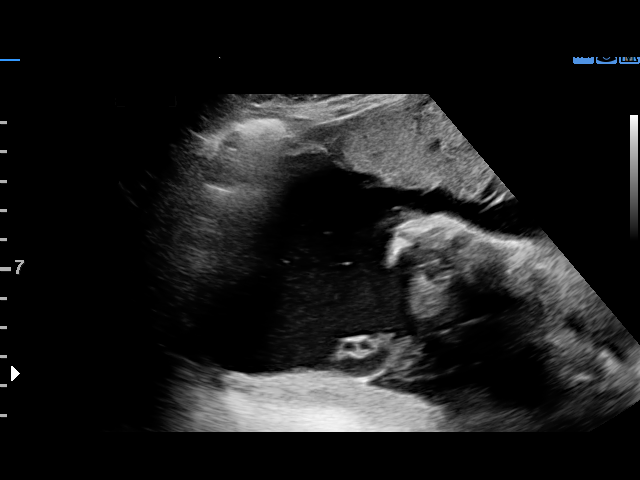
[im 48/57]
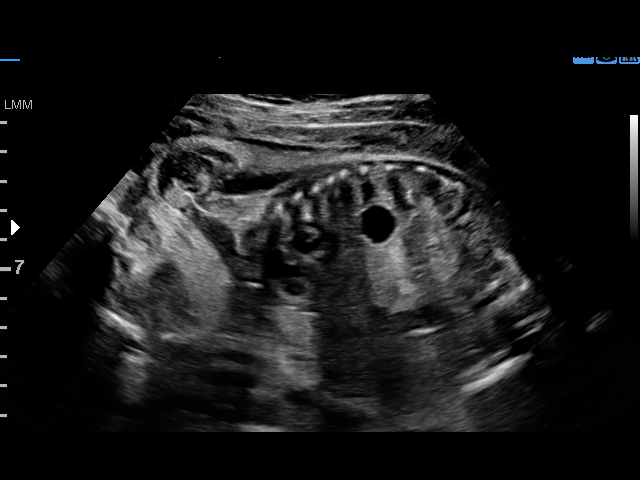
[im 52/57]
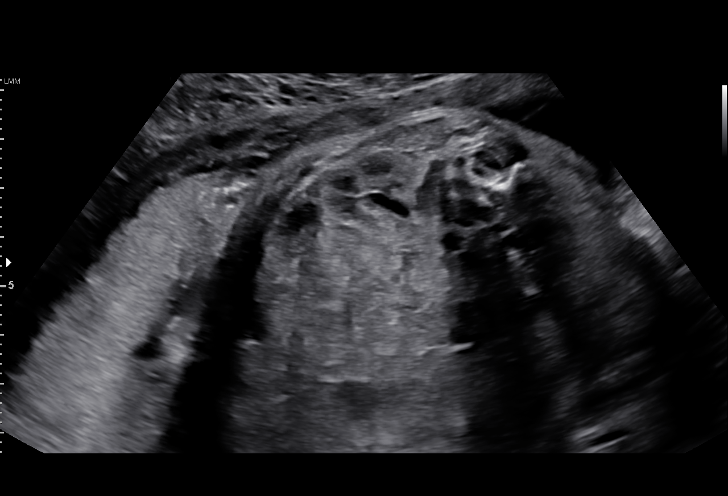
[im 57/57]
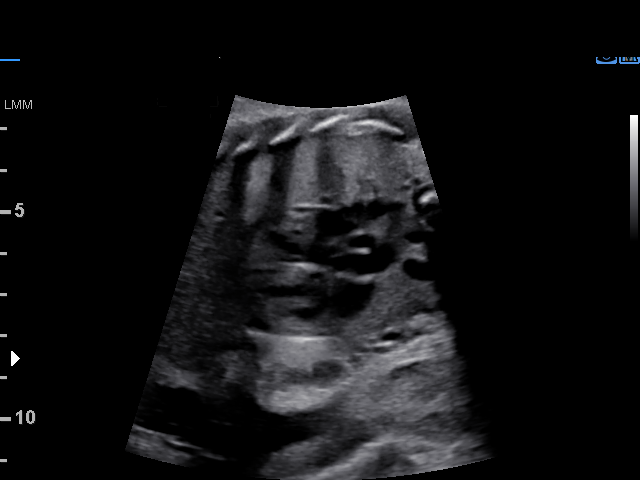

[14 of 28 positions shown; findings below may reference images not displayed]

1  JHONII RIINCON            [id number]      [id number]     [id number]
Indications

30 weeks gestation of pregnancy
Fetal abnormality - other known or
suspected (renal pyelectasis)
OB History

Gravidity:    3         Term:   1        Prem:   0        SAB:   0
TOP:          1       Ectopic:  0        Living: 1
Fetal Evaluation

Num Of Fetuses:     1
Fetal Heart         142
Rate(bpm):
Cardiac Activity:   Observed
Presentation:       Frank breech
Placenta:           Anterior, above cervical os
P. Cord Insertion:  Previously Visualized

Amniotic Fluid
AFI FV:      Subjectively within normal limits

AFI Sum(cm)     %Tile       Largest Pocket(cm)
19.62           76

RUQ(cm)       RLQ(cm)       LUQ(cm)        LLQ(cm)
2.75
Biometry

BPD:      72.5  mm     G. Age:  29w 1d         14  %    CI:        73.26   %   70 - 86
FL/HC:      19.9   %   19.2 -
HC:      269.2  mm     G. Age:  29w 2d          7  %    HC/AC:      0.98       0.99 -
AC:      273.5  mm     G. Age:  31w 3d         83  %    FL/BPD:     73.9   %   71 - 87
FL:       53.6  mm     G. Age:  28w 3d          6  %    FL/AC:      19.6   %   20 - 24
HUM:      47.7  mm     G. Age:  28w 0d         13  %

Est. FW:    4144  gm      3 lb 5 oz     55  %
Gestational Age

LMP:           30w 0d       Date:   03/04/16                 EDD:   12/09/16
U/S Today:     29w 4d                                        EDD:   12/12/16
Best:          30w 0d    Det. By:   LMP  (03/04/16)          EDD:   12/09/16
Anatomy

Cranium:               Appears normal         Aortic Arch:            Previously seen
Cavum:                 Appears normal         Ductal Arch:            Previously seen
Ventricles:            Appears normal         Diaphragm:              Previously seen
Choroid Plexus:        Previously seen        Stomach:                Appears normal, left
sided
Cerebellum:            Previously seen        Abdomen:                Appears normal
Posterior Fossa:       Previously seen        Abdominal Wall:         Previously seen
Nuchal Fold:           Not applicable (>20    Cord Vessels:           Previously seen
wks GA)
Face:                  Orbits and profile     Kidneys:                Appear normal
previously seen
Lips:                  Appears normal         Bladder:                Appears normal
Thoracic:              Appears normal         Spine:                  Previously seen
Heart:                 Appears normal         Upper Extremities:      Previously seen
(4CH, axis, and situs
RVOT:                  Previously seen        Lower Extremities:      Previously seen
LVOT:                  Previously seen

Other:  Technically difficult due to fetal position.
Cervix Uterus Adnexa

Cervix
Not visualized (advanced GA >35wks)

Uterus
No abnormality visualized.

Cul De Sac:   No free fluid seen.
Impression

SIUP at 30+0 weeks
Normal interval anatomy; anatomic survey complete;
pyelectasis resolved
Normal amniotic fluid volume
Appropriate interval growth with EFW at the 55th %tile
Recommendations

Follow-up as clinically indicated

## 2018-09-07 IMAGING — US US MFM FETAL BPP W/O NON-STRESS
1 series · 15 of 28 positions shown · non-contrast
Comparison: none

[Series 1: us mfm fetal bpp w/o non-stress · 40 acquisitions, 15 frames shown]
[im 1/40]
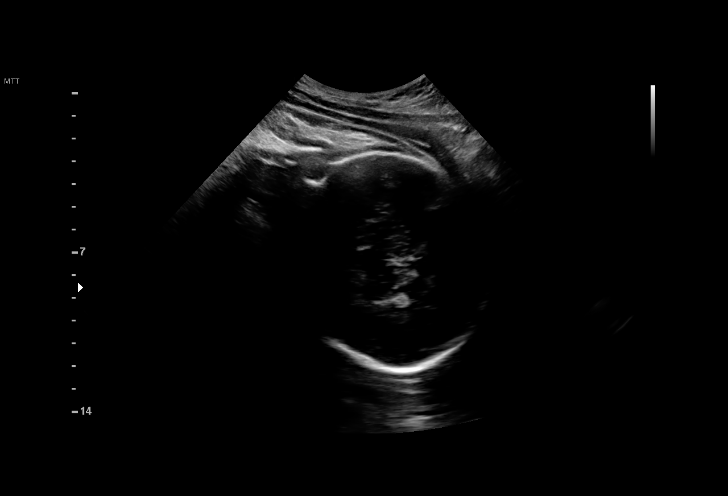
[im 3/40]
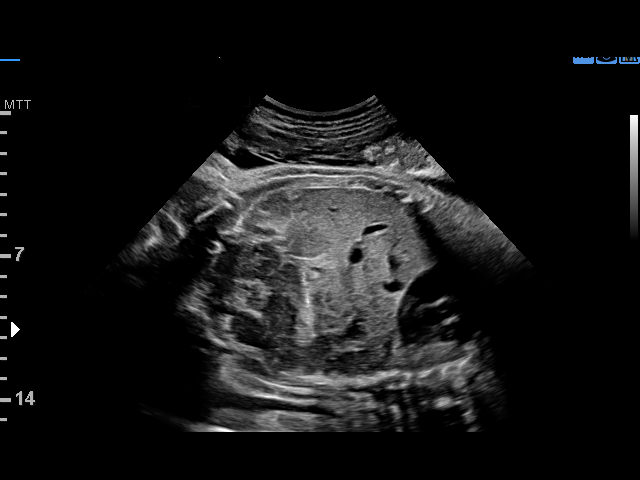
[im 6/40]
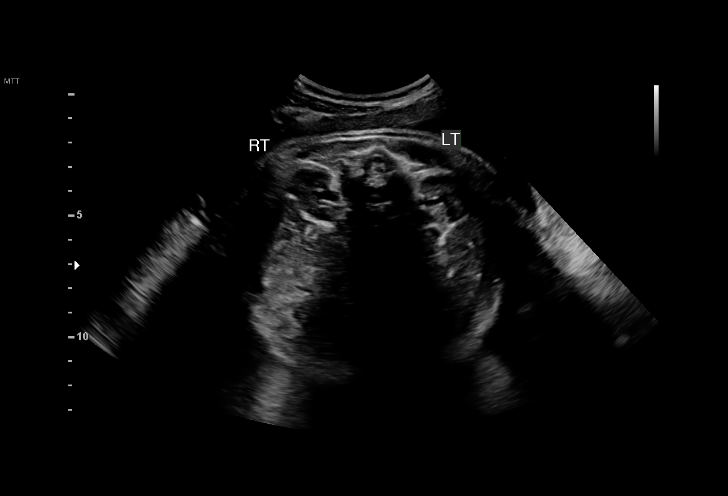
[im 9/40]
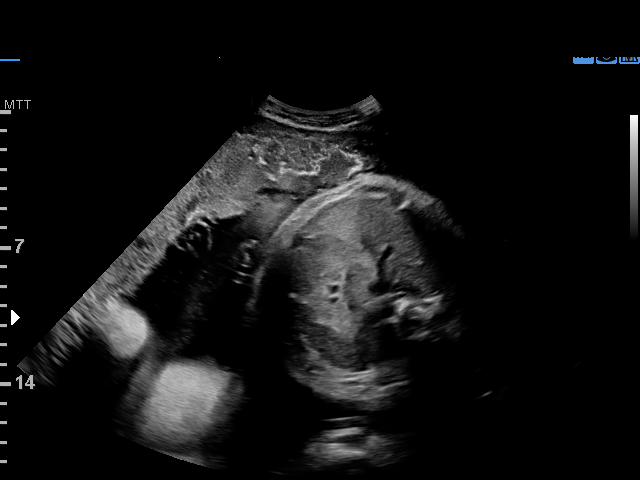
[im 12/40]
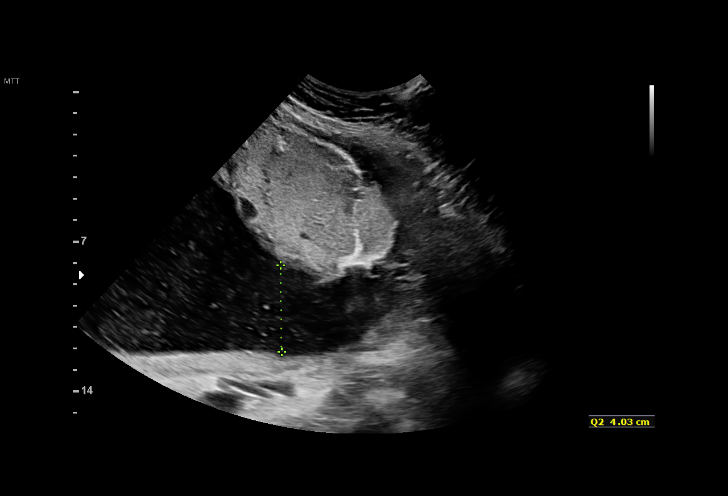
[im 15/40]
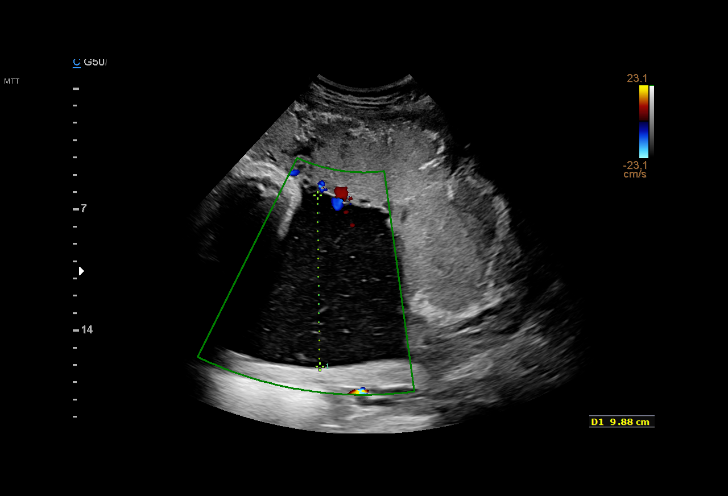
[im 18/40]
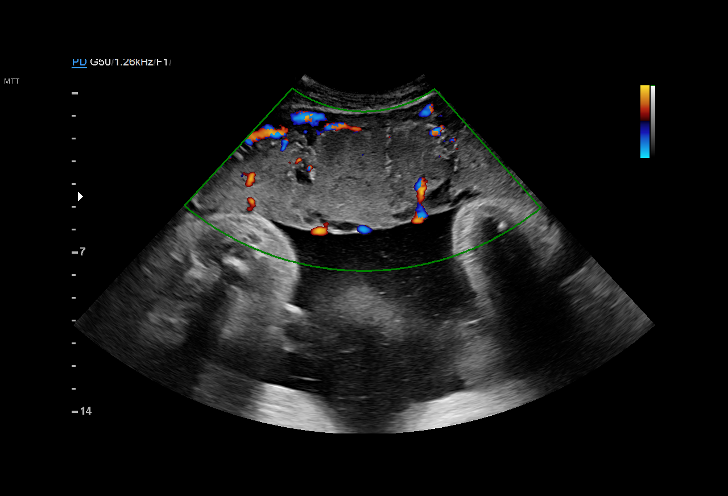
[im 21/40]
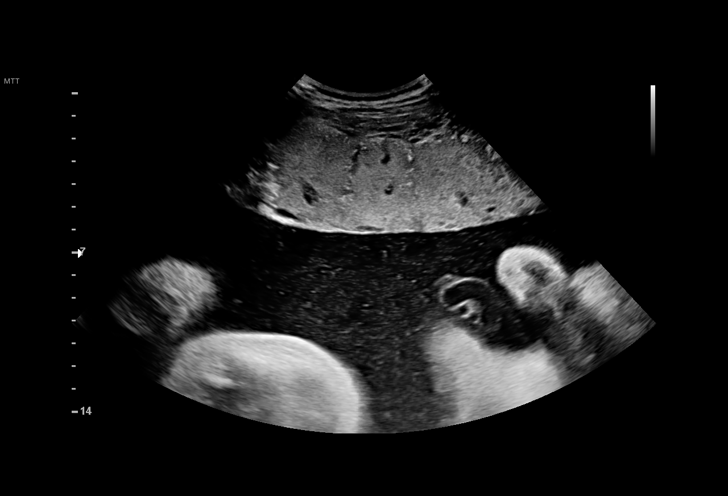
[im 22/40]
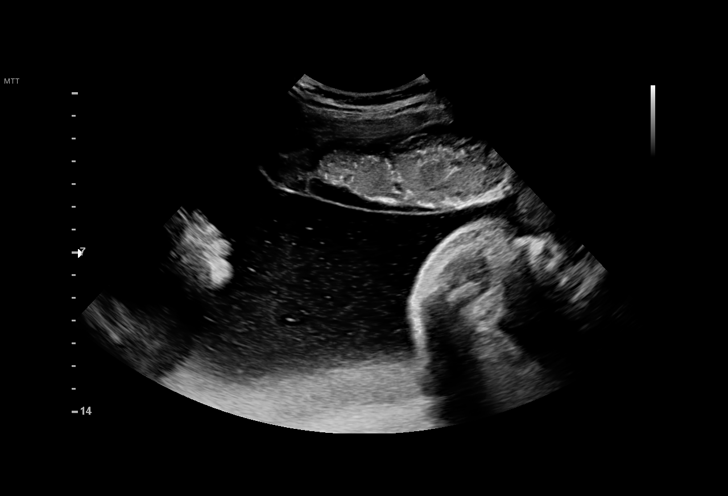
[im 25/40]
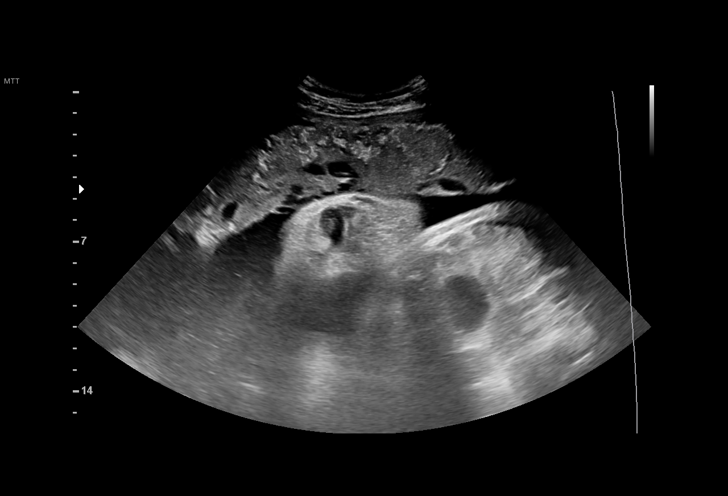
[im 28/40]
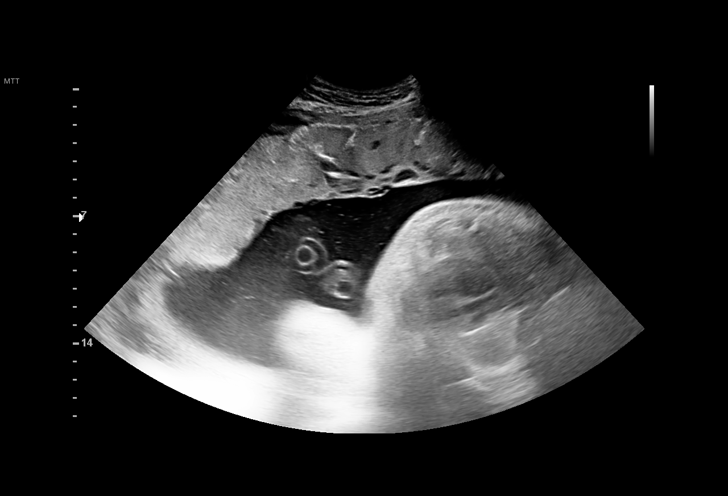
[im 31/40]
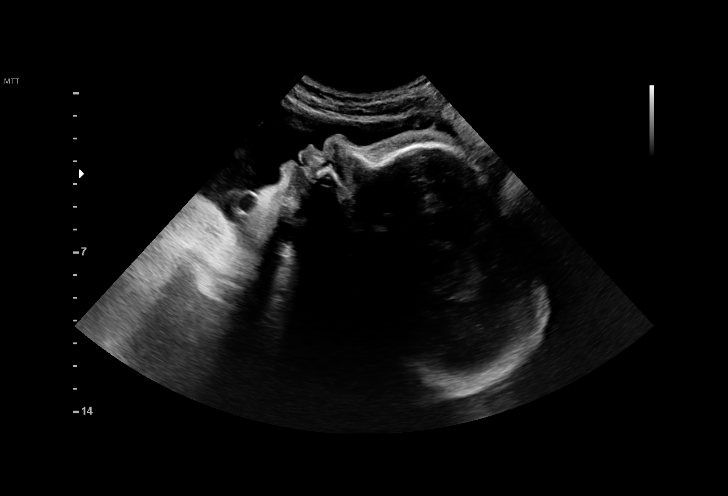
[im 34/40]
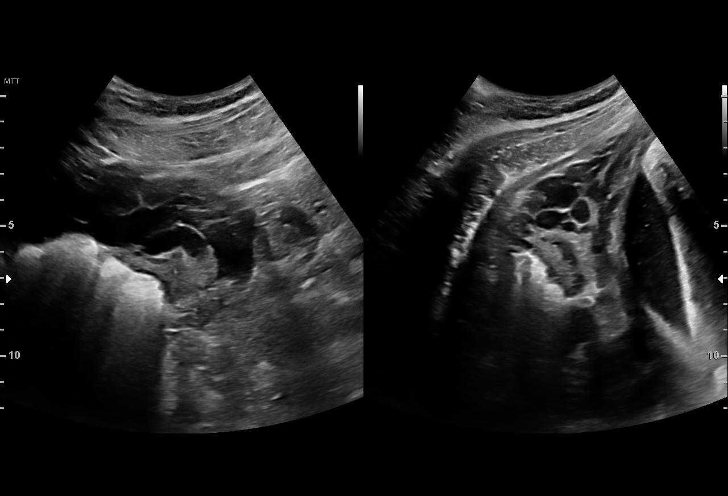
[im 37/40]
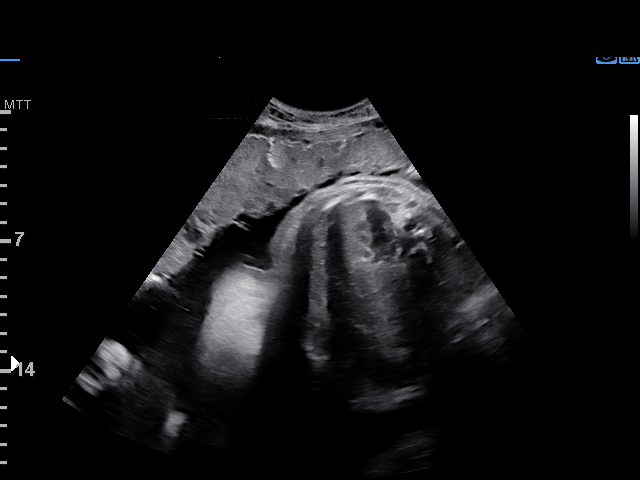
[im 40/40]
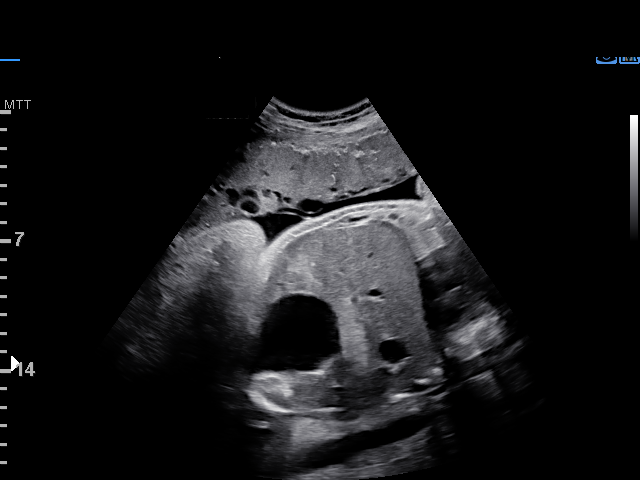

[15 of 28 positions shown; findings below may reference images not displayed]

MAU/Triage
GASING

1  MCHAREK DANNA         [id number]      [id number]     [id number]
Indications

38 weeks gestation of pregnancy
Gestational diabetes in pregnancy,
controlled by oral hypoglycemic drugs
Non-reactive NST
OB History

Gravidity:    3         Term:   1        Prem:   0        SAB:   0
TOP:          1       Ectopic:  0        Living: 1
Fetal Evaluation

Num Of Fetuses:     1
Fetal Heart         132
Rate(bpm):
Cardiac Activity:   Observed
Presentation:       Cephalic
Placenta:           Anterior, above cervical os
P. Cord Insertion:  Previously Visualized

Amniotic Fluid
AFI FV:      Mild polyhydramnios

AFI Sum(cm)     %Tile       Largest Pocket(cm)
25.[REDACTED]

RUQ(cm)       RLQ(cm)       LUQ(cm)        LLQ(cm)
11.45
Biophysical Evaluation

Amniotic F.V:   Pocket => 2 cm two         F. Tone:         Observed
planes
F. Movement:    Observed                   Score:           [DATE]
F. Breathing:   Observed
Gestational Age

LMP:           38w 3d        Date:  03/04/16                 EDD:   12/09/16
Best:          38w 3d     Det. By:  LMP  (03/04/16)          EDD:   12/09/16
Cervix Uterus Adnexa

Cervix
Not visualized (advanced GA >94wks)

Uterus
No abnormality visualized.

Left Ovary
No adnexal mass visualized.

Right Ovary
No adnexal mass visualized.

Cul De Sac:   No free fluid seen.

Adnexa:       No abnormality visualized.
Impression

SIUP at 38+3 weeks
Cephalic presentation
Mild polyhydramnios
BPP [DATE]
Recommendations

Follow-up as clinically indicated

## 2024-12-03 ENCOUNTER — Ambulatory Visit
Admission: RE | Admit: 2024-12-03 | Discharge: 2024-12-03 | Disposition: A | Source: Ambulatory Visit | Attending: Physician Assistant

## 2024-12-03 ENCOUNTER — Other Ambulatory Visit: Payer: Self-pay | Admitting: Physician Assistant

## 2024-12-03 DIAGNOSIS — S53125A Posterior dislocation of left ulnohumeral joint, initial encounter: Secondary | ICD-10-CM
